# Patient Record
Sex: Male | Born: 1955 | Race: White | Hispanic: No | Marital: Married | Smoking: Current every day smoker
Health system: Southern US, Community
[De-identification: ages and names within clinical notes are randomized; demographics above are authoritative.]

## PROBLEM LIST (undated history)

## (undated) DIAGNOSIS — M1990 Unspecified osteoarthritis, unspecified site: Secondary | ICD-10-CM

## (undated) DIAGNOSIS — I10 Essential (primary) hypertension: Secondary | ICD-10-CM

## (undated) DIAGNOSIS — E039 Hypothyroidism, unspecified: Secondary | ICD-10-CM

## (undated) HISTORY — PX: KNEE SURGERY: SHX244

## (undated) HISTORY — PX: HERNIA REPAIR: SHX51

## (undated) HISTORY — PX: EYE SURGERY: SHX253

## (undated) HISTORY — PX: REPLACEMENT TOTAL KNEE: SUR1224

---

## 2006-06-19 ENCOUNTER — Emergency Department: Payer: Self-pay

## 2010-12-26 ENCOUNTER — Emergency Department: Payer: Self-pay | Admitting: Emergency Medicine

## 2013-01-10 ENCOUNTER — Ambulatory Visit: Payer: Self-pay | Admitting: Internal Medicine

## 2019-12-06 ENCOUNTER — Other Ambulatory Visit: Payer: Self-pay | Admitting: Gastroenterology

## 2019-12-06 DIAGNOSIS — R1314 Dysphagia, pharyngoesophageal phase: Secondary | ICD-10-CM

## 2019-12-14 ENCOUNTER — Ambulatory Visit
Admission: RE | Admit: 2019-12-14 | Discharge: 2019-12-14 | Disposition: A | Discharge status: Home / Self Care | Payer: Medicare Other | Source: Ambulatory Visit | Attending: Gastroenterology | Admitting: Gastroenterology

## 2019-12-14 ENCOUNTER — Other Ambulatory Visit: Payer: Self-pay

## 2019-12-14 DIAGNOSIS — R1314 Dysphagia, pharyngoesophageal phase: Secondary | ICD-10-CM

## 2020-04-12 ENCOUNTER — Telehealth: Payer: Self-pay | Admitting: *Deleted

## 2020-04-12 ENCOUNTER — Encounter: Payer: Self-pay | Admitting: *Deleted

## 2020-04-12 DIAGNOSIS — Z87891 Personal history of nicotine dependence: Secondary | ICD-10-CM

## 2020-04-12 DIAGNOSIS — Z122 Encounter for screening for malignant neoplasm of respiratory organs: Secondary | ICD-10-CM

## 2020-04-12 NOTE — Telephone Encounter (Signed)
Received referral for initial lung cancer screening scan. Contacted patient and obtained smoking history,(current, 58.75) as well as answering questions related to screening process. Patient denies signs of lung cancer such as weight loss or hemoptysis. Patient denies comorbidity that would prevent curative treatment if lung cancer were found. Patient is scheduled for shared decision making visit and CT scan on 04/30/20 at 115pm.

## 2020-04-30 ENCOUNTER — Other Ambulatory Visit: Payer: Self-pay

## 2020-04-30 ENCOUNTER — Inpatient Hospital Stay: Payer: Medicare Other | Attending: Nurse Practitioner | Admitting: Nurse Practitioner

## 2020-04-30 ENCOUNTER — Ambulatory Visit
Admission: RE | Admit: 2020-04-30 | Discharge: 2020-04-30 | Disposition: A | Discharge status: Home / Self Care | Payer: Medicare Other | Source: Ambulatory Visit | Attending: Nurse Practitioner | Admitting: Nurse Practitioner

## 2020-04-30 DIAGNOSIS — Z87891 Personal history of nicotine dependence: Secondary | ICD-10-CM | POA: Insufficient documentation

## 2020-04-30 DIAGNOSIS — Z122 Encounter for screening for malignant neoplasm of respiratory organs: Secondary | ICD-10-CM | POA: Diagnosis present

## 2020-04-30 NOTE — Progress Notes (Signed)
Virtual Visit via Video Enabled Telemedicine Note   I connected with Lynden Oxford on 04/30/20 at 1:00 PM EST by video enabled telemedicine visit and verified that I am speaking with the correct person using two identifiers.   I discussed the limitations, risks, security and privacy concerns of performing an evaluation and management service by telemedicine and the availability of in-person appointments. I also discussed with the patient that there may be a patient responsible charge related to this service. The patient expressed understanding and agreed to proceed.   Other persons participating in the visit and their role in the encounter: Burgess Estelle, RN- checking in patient & navigation  Patient's location: [place]  Provider's location: Clinic  Chief Complaint: Low Dose CT Screening  Patient agreed to evaluation by telemedicine to discuss shared decision making for consideration of low dose CT lung cancer screening.    In accordance with CMS guidelines, patient has met eligibility criteria including age, absence of signs or symptoms of lung cancer.  Social History   Tobacco Use  . Smoking status: Current Every Day Smoker    Packs/day: 1.25    Years: 47.00    Pack years: 58.75    Types: Cigarettes  . Smokeless tobacco: Not on file  Substance Use Topics  . Alcohol use: Not on file     A shared decision-making session was conducted prior to the performance of CT scan. This includes one or more decision aids, includes benefits and harms of screening, follow-up diagnostic testing, over-diagnosis, false positive rate, and total radiation exposure.   Counseling on the importance of adherence to annual lung cancer LDCT screening, impact of co-morbidities, and ability or willingness to undergo diagnosis and treatment is imperative for compliance of the program.   Counseling on the importance of continued smoking cessation for former smokers; the importance of smoking cessation for  current smokers, and information about tobacco cessation interventions have been given to patient including [place] and [facility] [place] programs.   Written order for lung cancer screening with LDCT has been given to the patient and any and all questions have been answered to the best of my abilities.    Yearly follow up will be coordinated by Burgess Estelle, Thoracic Navigator.  I discussed the assessment and treatment plan with the patient. The patient was provided an opportunity to ask questions and all were answered. The patient agreed with the plan and demonstrated an understanding of the instructions.   The patient was advised to call back or seek an in-person evaluation if the symptoms worsen or if the condition fails to improve as anticipated.   I provided 15 minutes of face-to-face video visit time during this encounter, and > 50% was spent counseling as documented under my assessment & plan.   Beckey Rutter, DNP, AGNP-C [place] at [facility] [phone] (clinic)

## 2020-05-01 ENCOUNTER — Telehealth: Payer: Self-pay | Admitting: *Deleted

## 2020-05-01 NOTE — Telephone Encounter (Signed)
Notified patient of LDCT lung cancer screening program results with recommendation for 12 month follow up imaging. Also notified of incidental findings noted below and is encouraged to discuss further with PCP who will receive a copy of this note and/or the CT report. Patient verbalizes understanding.   IMPRESSION: 1. Lung-RADS 1, negative. Continue annual screening with low-dose chest CT without contrast in 12 months. 2. Aortic atherosclerosis. 3. Colonic diverticulosis.  Aortic Atherosclerosis (ICD10-I70

## 2020-05-10 ENCOUNTER — Encounter (INDEPENDENT_AMBULATORY_CARE_PROVIDER_SITE_OTHER): Payer: Medicare Other | Admitting: Ophthalmology

## 2020-05-10 ENCOUNTER — Other Ambulatory Visit: Payer: Self-pay

## 2020-05-10 DIAGNOSIS — H43813 Vitreous degeneration, bilateral: Secondary | ICD-10-CM | POA: Diagnosis not present

## 2020-05-10 DIAGNOSIS — H44752 Retained (nonmagnetic) (old) foreign body in vitreous body, left eye: Secondary | ICD-10-CM | POA: Diagnosis not present

## 2020-05-10 DIAGNOSIS — H2701 Aphakia, right eye: Secondary | ICD-10-CM

## 2020-05-10 DIAGNOSIS — H27131 Posterior dislocation of lens, right eye: Secondary | ICD-10-CM

## 2020-05-28 ENCOUNTER — Other Ambulatory Visit: Payer: Self-pay

## 2020-05-28 ENCOUNTER — Encounter (INDEPENDENT_AMBULATORY_CARE_PROVIDER_SITE_OTHER): Payer: Medicare Other | Admitting: Ophthalmology

## 2020-05-28 DIAGNOSIS — H43813 Vitreous degeneration, bilateral: Secondary | ICD-10-CM

## 2020-05-28 DIAGNOSIS — H27131 Posterior dislocation of lens, right eye: Secondary | ICD-10-CM

## 2020-05-28 DIAGNOSIS — H44752 Retained (nonmagnetic) (old) foreign body in vitreous body, left eye: Secondary | ICD-10-CM | POA: Diagnosis not present

## 2020-05-28 DIAGNOSIS — H2703 Aphakia, bilateral: Secondary | ICD-10-CM

## 2020-05-28 DIAGNOSIS — T8522XA Displacement of intraocular lens, initial encounter: Secondary | ICD-10-CM | POA: Diagnosis present

## 2020-05-28 NOTE — H&P (Signed)
Derek Fry is an 65 y.o. male.   Chief Complaint:sudden loss of vision left eye HPI: Cataract surgery in 2003.  Sudden vision loss 3 weeks ago.  Intraocular lens found in vitreous left eye.  No past medical history on file.    No family history on file. Social History:  reports that he has been smoking cigarettes. He has a 58.75 pack-year smoking history. He does not have any smokeless tobacco history on file. No history on file for alcohol use and drug use.  Allergies: Not on File  No medications prior to admission.    Review of systems otherwise negative  There were no vitals taken for this visit.  Physical exam: Mental status: oriented x3. Eyes: See eye exam associated with this date of surgery in media tab.  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 Plan: To [facility] for Pars plana vitrectomy, removal of intraocular lens from vitreous, placement of new intraocular lens with suture, laser, gas injection left eye  Derek Fry 05/28/2020, 4:14 PM

## 2020-06-14 ENCOUNTER — Other Ambulatory Visit (HOSPITAL_COMMUNITY)
Admission: RE | Admit: 2020-06-14 | Discharge: 2020-06-14 | Disposition: A | Discharge status: Home / Self Care | Payer: Medicare Other | Source: Ambulatory Visit | Attending: Ophthalmology | Admitting: Ophthalmology

## 2020-06-14 DIAGNOSIS — Z01812 Encounter for preprocedural laboratory examination: Secondary | ICD-10-CM | POA: Diagnosis present

## 2020-06-14 DIAGNOSIS — Z20822 Contact with and (suspected) exposure to covid-19: Secondary | ICD-10-CM | POA: Diagnosis not present

## 2020-06-14 LAB — SARS CORONAVIRUS 2 (TAT 6-24 HRS): SARS Coronavirus 2: NEGATIVE

## 2020-06-17 ENCOUNTER — Encounter (HOSPITAL_COMMUNITY): Payer: Self-pay | Admitting: Ophthalmology

## 2020-06-17 NOTE — Progress Notes (Signed)
Spoke with pt for pre-op call. Pt denies cardiac history. States he was treated "for a while for HTN, but was taken off medications 4-5 years ago. Pt states he is not diabetic.  Covid test done 06/14/20 and it's negative. Pt states he's been in quarantine since the test was done and understands that he stays in quarantine until he comes to the hospital tomorrow.   Dr. Ashley Royalty instructed pt to take his Synthroid and Flomax tonight, not in the AM. He instructed pt to arrive at the hospital at 9:00 AM.

## 2020-06-18 ENCOUNTER — Ambulatory Visit (HOSPITAL_COMMUNITY): Payer: Medicare Other | Admitting: Certified Registered Nurse Anesthetist

## 2020-06-18 ENCOUNTER — Ambulatory Visit (HOSPITAL_COMMUNITY)
Admission: RE | Admit: 2020-06-18 | Discharge: 2020-06-18 | Disposition: A | Discharge status: Home / Self Care | Payer: Medicare Other | Attending: Ophthalmology | Admitting: Ophthalmology

## 2020-06-18 ENCOUNTER — Encounter (HOSPITAL_COMMUNITY): Payer: Self-pay | Admitting: Ophthalmology

## 2020-06-18 ENCOUNTER — Other Ambulatory Visit: Payer: Self-pay

## 2020-06-18 ENCOUNTER — Encounter (HOSPITAL_COMMUNITY)
Admission: RE | Disposition: A | Discharge status: Home / Self Care | Payer: Self-pay | Source: Home / Self Care | Attending: Ophthalmology

## 2020-06-18 DIAGNOSIS — X58XXXA Exposure to other specified factors, initial encounter: Secondary | ICD-10-CM | POA: Insufficient documentation

## 2020-06-18 DIAGNOSIS — H2702 Aphakia, left eye: Secondary | ICD-10-CM | POA: Diagnosis not present

## 2020-06-18 DIAGNOSIS — H27132 Posterior dislocation of lens, left eye: Secondary | ICD-10-CM | POA: Diagnosis not present

## 2020-06-18 DIAGNOSIS — T8522XA Displacement of intraocular lens, initial encounter: Secondary | ICD-10-CM | POA: Diagnosis not present

## 2020-06-18 DIAGNOSIS — F17210 Nicotine dependence, cigarettes, uncomplicated: Secondary | ICD-10-CM | POA: Insufficient documentation

## 2020-06-18 HISTORY — PX: PARS PLANA VITRECTOMY: SHX2166

## 2020-06-18 HISTORY — DX: Unspecified osteoarthritis, unspecified site: M19.90

## 2020-06-18 HISTORY — DX: Hypothyroidism, unspecified: E03.9

## 2020-06-18 HISTORY — DX: Essential (primary) hypertension: I10

## 2020-06-18 LAB — POCT I-STAT, CHEM 8
BUN: 16 mg/dL (ref 8–23)
Calcium, Ion: 1.16 mmol/L (ref 1.15–1.40)
Chloride: 107 mmol/L (ref 98–111)
Creatinine, Ser: 1 mg/dL (ref 0.61–1.24)
Glucose, Bld: 97 mg/dL (ref 70–99)
HCT: 51 % (ref 39.0–52.0)
Hemoglobin: 17.3 g/dL — ABNORMAL HIGH (ref 13.0–17.0)
Potassium: 3.9 mmol/L (ref 3.5–5.1)
Sodium: 141 mmol/L (ref 135–145)
TCO2: 20 mmol/L — ABNORMAL LOW (ref 22–32)

## 2020-06-18 SURGERY — PARS PLANA VITRECTOMY WITH 25G REMOVAL/SUTURE INTRAOCULAR LENS
Anesthesia: General | Site: Eye | Laterality: Left

## 2020-06-18 MED ORDER — GATIFLOXACIN 0.5 % OP SOLN
1.0000 [drp] | OPHTHALMIC | Status: AC | PRN
  Administered 2020-06-18 (×3): 1 [drp] via OPHTHALMIC
  Filled 2020-06-18: qty 2.5

## 2020-06-18 MED ORDER — DORZOLAMIDE HCL-TIMOLOL MAL 2-0.5 % OP SOLN
1.0000 [drp] | Freq: Two times a day (BID) | OPHTHALMIC | Status: DC
  Administered 2020-06-18: 1 [drp] via OPHTHALMIC
  Filled 2020-06-18 (×2): qty 10

## 2020-06-18 MED ORDER — SUGAMMADEX SODIUM 200 MG/2ML IV SOLN
INTRAVENOUS | Status: DC | PRN
  Administered 2020-06-18: 200 mg via INTRAVENOUS

## 2020-06-18 MED ORDER — PHENYLEPHRINE HCL-NACL 10-0.9 MG/250ML-% IV SOLN
INTRAVENOUS | Status: DC | PRN
  Administered 2020-06-18: 25 ug/min via INTRAVENOUS

## 2020-06-18 MED ORDER — BSS IO SOLN
INTRAOCULAR | Status: DC | PRN
  Administered 2020-06-18 (×2): 15 mL via INTRAOCULAR

## 2020-06-18 MED ORDER — PHENYLEPHRINE HCL 2.5 % OP SOLN
1.0000 [drp] | OPHTHALMIC | Status: AC | PRN
  Administered 2020-06-18 (×3): 1 [drp] via OPHTHALMIC
  Filled 2020-06-18: qty 2

## 2020-06-18 MED ORDER — FENTANYL CITRATE (PF) 250 MCG/5ML IJ SOLN
INTRAMUSCULAR | Status: DC | PRN
  Administered 2020-06-18: 100 ug via INTRAVENOUS

## 2020-06-18 MED ORDER — TRIAMCINOLONE ACETONIDE 40 MG/ML IJ SUSP
INTRAMUSCULAR | Status: AC
  Filled 2020-06-18: qty 5

## 2020-06-18 MED ORDER — BACITRACIN-POLYMYXIN B 500-10000 UNIT/GM OP OINT
TOPICAL_OINTMENT | OPHTHALMIC | Status: AC
  Filled 2020-06-18: qty 3.5

## 2020-06-18 MED ORDER — PHENYLEPHRINE HCL (PRESSORS) 10 MG/ML IV SOLN
INTRAVENOUS | Status: DC | PRN
  Administered 2020-06-18 (×2): 80 ug via INTRAVENOUS

## 2020-06-18 MED ORDER — FENTANYL CITRATE (PF) 100 MCG/2ML IJ SOLN
25.0000 ug | INTRAMUSCULAR | Status: DC | PRN
Start: 2020-06-18 — End: 2020-06-18

## 2020-06-18 MED ORDER — HEMOSTATIC AGENTS (NO CHARGE) OPTIME
TOPICAL | Status: DC | PRN
  Administered 2020-06-18: 1 via TOPICAL

## 2020-06-18 MED ORDER — BSS PLUS IO SOLN
INTRAOCULAR | Status: AC
  Filled 2020-06-18: qty 500

## 2020-06-18 MED ORDER — BUPIVACAINE HCL (PF) 0.75 % IJ SOLN
INTRAMUSCULAR | Status: DC | PRN
  Administered 2020-06-18: 10 mL

## 2020-06-18 MED ORDER — LIDOCAINE HCL 2 % IJ SOLN
INTRAMUSCULAR | Status: AC
  Filled 2020-06-18: qty 20

## 2020-06-18 MED ORDER — CEFTAZIDIME 1 G IJ SOLR
INTRAMUSCULAR | Status: AC
  Filled 2020-06-18: qty 1

## 2020-06-18 MED ORDER — FENTANYL CITRATE (PF) 250 MCG/5ML IJ SOLN
INTRAMUSCULAR | Status: AC
  Filled 2020-06-18: qty 5

## 2020-06-18 MED ORDER — STERILE WATER FOR INJECTION IJ SOLN
INTRAMUSCULAR | Status: DC | PRN
  Administered 2020-06-18: 20 mL

## 2020-06-18 MED ORDER — ORAL CARE MOUTH RINSE: 15.0000 mL | Freq: Once | OROMUCOSAL | Status: AC

## 2020-06-18 MED ORDER — MIDAZOLAM HCL 2 MG/2ML IJ SOLN
INTRAMUSCULAR | Status: AC
  Filled 2020-06-18: qty 2

## 2020-06-18 MED ORDER — BUPIVACAINE HCL (PF) 0.75 % IJ SOLN
INTRAMUSCULAR | Status: AC
  Filled 2020-06-18: qty 10

## 2020-06-18 MED ORDER — CEFAZOLIN SODIUM-DEXTROSE 2-4 GM/100ML-% IV SOLN
2.0000 g | INTRAVENOUS | Status: AC
  Administered 2020-06-18: 2 g via INTRAVENOUS

## 2020-06-18 MED ORDER — ROCURONIUM BROMIDE 10 MG/ML (PF) SYRINGE
PREFILLED_SYRINGE | INTRAVENOUS | Status: DC | PRN
  Administered 2020-06-18 (×2): 20 mg via INTRAVENOUS
  Administered 2020-06-18: 50 mg via INTRAVENOUS
  Administered 2020-06-18: 10 mg via INTRAVENOUS

## 2020-06-18 MED ORDER — POLYMYXIN B SULFATE 500000 UNITS IJ SOLR
INTRAMUSCULAR | Status: AC
  Filled 2020-06-18: qty 500000

## 2020-06-18 MED ORDER — SODIUM HYALURONATE 10 MG/ML IO SOLN
INTRAOCULAR | Status: AC
  Filled 2020-06-18: qty 0.85

## 2020-06-18 MED ORDER — DEXAMETHASONE SODIUM PHOSPHATE 10 MG/ML IJ SOLN
INTRAMUSCULAR | Status: DC | PRN
  Administered 2020-06-18: 10 mg

## 2020-06-18 MED ORDER — SODIUM CHLORIDE 0.9 % IV SOLN: INTRAVENOUS | Status: DC

## 2020-06-18 MED ORDER — ACETAZOLAMIDE SODIUM 500 MG IJ SOLR
INTRAMUSCULAR | Status: AC
  Filled 2020-06-18: qty 500

## 2020-06-18 MED ORDER — EPINEPHRINE PF 1 MG/ML IJ SOLN
INTRAMUSCULAR | Status: DC | PRN
  Administered 2020-06-18: .3 mL

## 2020-06-18 MED ORDER — HYALURONIDASE HUMAN 150 UNIT/ML IJ SOLN
INTRAMUSCULAR | Status: AC
  Filled 2020-06-18: qty 1

## 2020-06-18 MED ORDER — STERILE WATER FOR INJECTION IJ SOLN
INTRAMUSCULAR | Status: AC
  Filled 2020-06-18: qty 20

## 2020-06-18 MED ORDER — PROPOFOL 10 MG/ML IV BOLUS
INTRAVENOUS | Status: DC | PRN
  Administered 2020-06-18: 150 mg via INTRAVENOUS
  Administered 2020-06-18: 50 mg via INTRAVENOUS

## 2020-06-18 MED ORDER — CEFAZOLIN SODIUM-DEXTROSE 2-4 GM/100ML-% IV SOLN
INTRAVENOUS | Status: AC
  Filled 2020-06-18: qty 100

## 2020-06-18 MED ORDER — DEXAMETHASONE SODIUM PHOSPHATE 10 MG/ML IJ SOLN
INTRAMUSCULAR | Status: AC
  Filled 2020-06-18: qty 1

## 2020-06-18 MED ORDER — BSS IO SOLN
INTRAOCULAR | Status: AC
  Filled 2020-06-18: qty 15

## 2020-06-18 MED ORDER — 0.9 % SODIUM CHLORIDE (POUR BTL) OPTIME
TOPICAL | Status: DC | PRN
  Administered 2020-06-18: 1000 mL

## 2020-06-18 MED ORDER — MIDAZOLAM HCL 5 MG/5ML IJ SOLN
INTRAMUSCULAR | Status: DC | PRN
  Administered 2020-06-18: 2 mg via INTRAVENOUS

## 2020-06-18 MED ORDER — CHLORHEXIDINE GLUCONATE 0.12 % MT SOLN
OROMUCOSAL | Status: AC
  Administered 2020-06-18: 15 mL via OROMUCOSAL
  Filled 2020-06-18: qty 15

## 2020-06-18 MED ORDER — MEPERIDINE HCL 25 MG/ML IJ SOLN: 6.2500 mg | INTRAMUSCULAR | Status: DC | PRN

## 2020-06-18 MED ORDER — DEXAMETHASONE SODIUM PHOSPHATE 10 MG/ML IJ SOLN
INTRAMUSCULAR | Status: DC | PRN
  Administered 2020-06-18: 5 mg via INTRAVENOUS

## 2020-06-18 MED ORDER — ONDANSETRON HCL 4 MG/2ML IJ SOLN
INTRAMUSCULAR | Status: DC | PRN
  Administered 2020-06-18: 4 mg via INTRAVENOUS

## 2020-06-18 MED ORDER — HYPROMELLOSE (GONIOSCOPIC) 2.5 % OP SOLN
OPHTHALMIC | Status: AC
  Filled 2020-06-18: qty 15

## 2020-06-18 MED ORDER — EPINEPHRINE PF 1 MG/ML IJ SOLN
INTRAMUSCULAR | Status: AC
  Filled 2020-06-18: qty 1

## 2020-06-18 MED ORDER — TROPICAMIDE 1 % OP SOLN
1.0000 [drp] | OPHTHALMIC | Status: AC | PRN
  Administered 2020-06-18 (×3): 1 [drp] via OPHTHALMIC
  Filled 2020-06-18: qty 15

## 2020-06-18 MED ORDER — BACITRACIN-POLYMYXIN B 500-10000 UNIT/GM OP OINT
TOPICAL_OINTMENT | OPHTHALMIC | Status: DC | PRN
  Administered 2020-06-18: 1 via OPHTHALMIC

## 2020-06-18 MED ORDER — PROMETHAZINE HCL 25 MG/ML IJ SOLN: 6.2500 mg | INTRAMUSCULAR | Status: DC | PRN

## 2020-06-18 MED ORDER — PROPOFOL 10 MG/ML IV BOLUS
INTRAVENOUS | Status: AC
  Filled 2020-06-18: qty 20

## 2020-06-18 MED ORDER — SODIUM CHLORIDE (PF) 0.9 % IJ SOLN
INTRAMUSCULAR | Status: AC
  Filled 2020-06-18: qty 10

## 2020-06-18 MED ORDER — CHLORHEXIDINE GLUCONATE 0.12 % MT SOLN: 15.0000 mL | Freq: Once | OROMUCOSAL | Status: AC

## 2020-06-18 MED ORDER — BSS PLUS IO SOLN
INTRAOCULAR | Status: DC | PRN
  Administered 2020-06-18: 1 via INTRAOCULAR

## 2020-06-18 MED ORDER — CYCLOPENTOLATE HCL 1 % OP SOLN
1.0000 [drp] | OPHTHALMIC | Status: AC | PRN
  Administered 2020-06-18 (×3): 1 [drp] via OPHTHALMIC
  Filled 2020-06-18: qty 2

## 2020-06-18 MED ORDER — ATROPINE SULFATE 1 % OP SOLN
OPHTHALMIC | Status: AC
  Filled 2020-06-18: qty 5

## 2020-06-18 MED ORDER — PROVISC 10 MG/ML IO SOLN
INTRAOCULAR | Status: DC | PRN
  Administered 2020-06-18: .85 mL via INTRAOCULAR

## 2020-06-18 SURGICAL SUPPLY — 70 items
APPLICATOR DR MATTHEWS STRL (MISCELLANEOUS) IMPLANT
BAND WRIST GAS GREEN (MISCELLANEOUS) IMPLANT
BLADE EYE CATARACT 19 1.4 BEAV (BLADE) IMPLANT
BLADE KERATOME 2.75 (BLADE) ×2 IMPLANT
CABLE BIPOLOR RESECTION CORD (MISCELLANEOUS) ×2 IMPLANT
CANNULA VLV SOFT TIP 25G (OPHTHALMIC) ×1 IMPLANT
CANNULA VLV SOFT TIP 25GA (OPHTHALMIC) ×2 IMPLANT
COTTONBALL LRG STERILE PKG (GAUZE/BANDAGES/DRESSINGS) ×6 IMPLANT
COVER MAYO STAND STRL (DRAPES) ×2 IMPLANT
COVER WAND RF STERILE (DRAPES) ×2 IMPLANT
DRAPE INCISE 51X51 W/FILM STRL (DRAPES) IMPLANT
DRAPE OPHTHALMIC 77X100 STRL (CUSTOM PROCEDURE TRAY) ×2 IMPLANT
FILTER BLUE MILLIPORE (MISCELLANEOUS) IMPLANT
FORCEPS ECKARDT ILM 25G SERR (OPHTHALMIC RELATED) IMPLANT
FORCEPS GRIESHABER ILM 25G A (INSTRUMENTS) ×2 IMPLANT
FORCEPS HORIZONTAL 25G DISP (OPHTHALMIC RELATED) IMPLANT
GAS OPHTHALMIC (MISCELLANEOUS) IMPLANT
GAS WRIST BAND GREEN (MISCELLANEOUS)
GLOVE SS BIOGEL STRL SZ 6.5 (GLOVE) ×2 IMPLANT
GLOVE SS BIOGEL STRL SZ 7 (GLOVE) ×1 IMPLANT
GLOVE SUPERSENSE BIOGEL SZ 6.5 (GLOVE) ×2
GLOVE SUPERSENSE BIOGEL SZ 7 (GLOVE) ×1
GLOVE SURG 8.5 LATEX PF (GLOVE) ×2 IMPLANT
GLOVE TRIUMPH SURG SIZE 8.5 (KITS) ×2 IMPLANT
GOWN STRL REUS W/ TWL LRG LVL3 (GOWN DISPOSABLE) ×3 IMPLANT
GOWN STRL REUS W/TWL LRG LVL3 (GOWN DISPOSABLE) ×3
HANDLE PNEUMATIC FOR CONSTEL (OPHTHALMIC) ×2 IMPLANT
KIT BASIN OR (CUSTOM PROCEDURE TRAY) ×2 IMPLANT
KIT TURNOVER KIT B (KITS) ×2 IMPLANT
KNIFE CRESCENT 1.75 EDGEAHEAD (BLADE) IMPLANT
LENS IOL POST 1PIECE DIOP 26.5 (Intraocular Lens) ×1 IMPLANT
MICROPICK 25G (MISCELLANEOUS)
NDL 18GX1X1/2 (RX/OR ONLY) (NEEDLE) ×1 IMPLANT
NDL 25GX 5/8IN NON SAFETY (NEEDLE) ×1 IMPLANT
NDL 27GX1/2 REG BEVEL ECLIP (NEEDLE) IMPLANT
NDL FILTER BLUNT 18X1 1/2 (NEEDLE) ×1 IMPLANT
NDL HYPO 30X.5 LL (NEEDLE) ×1 IMPLANT
NEEDLE 18GX1X1/2 (RX/OR ONLY) (NEEDLE) ×2 IMPLANT
NEEDLE 25GX 5/8IN NON SAFETY (NEEDLE) ×2 IMPLANT
NEEDLE 27GX1/2 REG BEVEL ECLIP (NEEDLE) IMPLANT
NEEDLE FILTER BLUNT 18X 1/2SAF (NEEDLE) ×1
NEEDLE FILTER BLUNT 18X1 1/2 (NEEDLE) ×1 IMPLANT
NEEDLE HYPO 30X.5 LL (NEEDLE) ×2 IMPLANT
NS IRRIG 1000ML POUR BTL (IV SOLUTION) ×2 IMPLANT
PACK VITRECTOMY CUSTOM (CUSTOM PROCEDURE TRAY) ×2 IMPLANT
PAD ARMBOARD 7.5X6 YLW CONV (MISCELLANEOUS) ×4 IMPLANT
PAK PIK VITRECTOMY CVS 25GA (OPHTHALMIC) ×2 IMPLANT
PENCIL BIPOLAR 25GA STR DISP (OPHTHALMIC RELATED) IMPLANT
PIC ILLUMINATED 25G (OPHTHALMIC) ×2
PICK MICROPICK 25G (MISCELLANEOUS) IMPLANT
PIK ILLUMINATED 25G (OPHTHALMIC) ×1 IMPLANT
PROBE LASER ILLUM FLEX CVD 25G (OPHTHALMIC) IMPLANT
ROLLS DENTAL (MISCELLANEOUS) ×4 IMPLANT
SCRAPER DIAMOND 25GA (OPHTHALMIC RELATED) IMPLANT
SPONGE SURGIFOAM ABS GEL 12-7 (HEMOSTASIS) ×2 IMPLANT
STOPCOCK 4 WAY LG BORE MALE ST (IV SETS) IMPLANT
SUT CHROMIC 7 0 TG140 8 (SUTURE) ×2 IMPLANT
SUT ETHILON 10 0 CS140 6 (SUTURE) ×2 IMPLANT
SUT ETHILON 9 0 TG140 8 (SUTURE) IMPLANT
SUT POLY NON ABSORB 10-0 8 STR (SUTURE) ×3 IMPLANT
SUT SILK 4 0 RB 1 (SUTURE) ×1 IMPLANT
SYR 10ML LL (SYRINGE) IMPLANT
SYR 20ML LL LF (SYRINGE) ×2 IMPLANT
SYR 5ML LL (SYRINGE) IMPLANT
SYR BULB EAR ULCER 3OZ GRN STR (SYRINGE) ×2 IMPLANT
SYR TB 1ML LUER SLIP (SYRINGE) ×2 IMPLANT
TAPE SURG TRANSPORE 1 IN (GAUZE/BANDAGES/DRESSINGS) ×1 IMPLANT
TAPE SURGICAL TRANSPORE 1 IN (GAUZE/BANDAGES/DRESSINGS) ×1
TUBING HIGH PRESS EXTEN 6IN (TUBING) IMPLANT
WATER STERILE IRR 1000ML POUR (IV SOLUTION) ×2 IMPLANT

## 2020-06-18 NOTE — H&P (Signed)
I examined the patient today and there is no change in the medical status 

## 2020-06-18 NOTE — Anesthesia Procedure Notes (Signed)
Procedure Name: Intubation Date/Time: 06/18/2020 11:40 AM Performed by: Glynda Jaeger, CRNA Pre-anesthesia Checklist: Patient identified, Patient being monitored, Timeout performed, Emergency Drugs available and Suction available Patient Re-evaluated:Patient Re-evaluated prior to induction Oxygen Delivery Method: Circle System Utilized Preoxygenation: Pre-oxygenation with 100% oxygen Induction Type: IV induction Ventilation: Two handed mask ventilation required and Oral airway inserted - appropriate to patient size Laryngoscope Size: Mac and 4 Grade View: Grade III Tube type: Oral Tube size: 7.5 mm Number of attempts: 1 Airway Equipment and Method: Stylet Placement Confirmation: ETT inserted through vocal cords under direct vision,  positive ETCO2 and breath sounds checked- equal and bilateral Secured at: 21 cm Tube secured with: Tape Dental Injury: Teeth and Oropharynx as per pre-operative assessment

## 2020-06-18 NOTE — Progress Notes (Signed)
Dr. Renold Don aware that patient's BMET hemolyzed and gave verbal order for ISTAT 8.

## 2020-06-18 NOTE — Anesthesia Preprocedure Evaluation (Addendum)
Anesthesia Evaluation  Patient identified by MRN, date of birth, ID band Patient awake    Reviewed: Allergy & Precautions, NPO status , Patient's Chart, lab work & pertinent test results  Airway Mallampati: II  TM Distance: >3 FB Neck ROM: Full    Dental  (+) Dental Advisory Given, Missing, Chipped   Pulmonary Current Smoker,    Pulmonary exam normal breath sounds clear to auscultation       Cardiovascular hypertension, Pt. on medications Normal cardiovascular exam Rhythm:Regular Rate:Normal     Neuro/Psych negative neurological ROS     GI/Hepatic negative GI ROS, Neg liver ROS,   Endo/Other  Hypothyroidism   Renal/GU negative Renal ROS     Musculoskeletal  (+) Arthritis ,   Abdominal   Peds  Hematology negative hematology ROS (+)   Anesthesia Other Findings   Reproductive/Obstetrics                            Anesthesia Physical Anesthesia Plan  ASA: II  Anesthesia Plan: General   Post-op Pain Management:    Induction: Intravenous  PONV Risk Score and Plan: 1 and Ondansetron, Dexamethasone, Midazolam and Treatment may vary due to age or medical condition  Airway Management Planned: Oral ETT  Additional Equipment: None  Intra-op Plan:   Post-operative Plan: Extubation in OR  Informed Consent: I have reviewed the patients History and Physical, chart, labs and discussed the procedure including the risks, benefits and alternatives for the proposed anesthesia with the patient or authorized representative who has indicated his/her understanding and acceptance.     Dental advisory given  Plan Discussed with: CRNA  Anesthesia Plan Comments:        Anesthesia Quick Evaluation

## 2020-06-18 NOTE — Transfer of Care (Signed)
Immediate Anesthesia Transfer of Care Note  Patient: Derek Fry  Procedure(s) Performed: PARS PLANA VITRECTOMY WITH 25G REMOVAL/SUTURE INTRAOCULAR LENS; LASER, DIATHERMY LEFT EYE (Left Eye)  Patient Location: PACU  Anesthesia Type:General  Level of Consciousness: awake, alert , oriented, patient cooperative and responds to stimulation  Airway & Oxygen Therapy: Patient Spontanous Breathing and Patient connected to nasal cannula oxygen  Post-op Assessment: Report given to RN, Post -op Vital signs reviewed and stable and Patient moving all extremities X 4  Post vital signs: Reviewed and stable  Last Vitals:  Vitals Value Taken Time  BP 147/70 06/18/20 1412  Temp    Pulse 89 06/18/20 1414  Resp 18 06/18/20 1414  SpO2 96 % 06/18/20 1414  Vitals shown include unvalidated device data.  Last Pain:  Vitals:   06/18/20 0939  PainSc: 0-No pain      Patients Stated Pain Goal: 3 (06/18/20 0939)  Complications: No complications documented.

## 2020-06-18 NOTE — Op Note (Signed)
NAME: Derek Fry, Derek Fry MEDICAL RECORD CW:[id number] ACCOUNT [id number] DATE OF BIRTH:01-Mar-1956 FACILITY: [facility] LOCATION: [facility] PHYSICIAN:Brooke D. , MD  OPERATIVE REPORT  DATE OF PROCEDURE:  06/18/2020  ADMISSION DIAGNOSIS:  Dislocated intraocular lens posteriorly, left eye.  PROCEDURES:  Pars plana vitrectomy, removal of dislocated intraocular lens from the vitreous, placement of secondary intraocular lens with suture, laser, gas infusion, all in the left eye.  SURGEON:  Alan Mulder, MD  ASSISTANT:  Rosalie Doctor, SA  ANESTHESIA:  General.  DESCRIPTION OF PROCEDURE:  Usual prep and drape.  The indirect ophthalmoscope laser was moved into place.  806 burns were placed around the retinal periphery.  The power was 300 milliwatts 1000 microns each and 0.1 seconds each.  The attention was then  carried to the pars plana area.  There was a conjunctival peritomy from 8 o'clock to 4 o'clock.  Half-thickness scleral flaps were raised at 3 and 9 o'clock in anticipation of IOL suture.  Corneoscleral window was created between 10 o'clock and 2 o'clock  for 8 mm in width.  A 3-layered wound was created, which was self-sealing.  25-gauge trocars were placed at 10, 2, and 4 o'clock with infusion at 4 o'clock.  Provisc was placed on the corneal surface, and pars plana vitrectomy was begun.  The  intraocular lens was found in the posterior segment of the eye.  It was ensnared in vitreous.  The cutter was used to remove all vitreous attachments to the intraocular lens.  A corneoscleral wound was created from 10 o'clock to 2 o'clock and the  keratome was used to enter the anterior chamber.  Once the intraocular lens was freed from its vitreous attachments, it was raised into the anterior chamber with 25-gauge automated forceps.  Forceps were then passed through the corneoscleral wound to grab the lens and remove it from the eye.  The pseudophakos was sent for pathologic identification.  Additional  vitrectomy was carried out at this point removing all vitreous to wound fragments.  Two Prolene sutures were passed beneath the scleral  flaps at 3 and 9 o'clock.  A docking needle was used for the passing.  The Prolene sutures were externalized through the corneoscleral wound.  A new intraocular lens was brought onto the field.  This was made by [employer] model CZ7OBD,  power 26.5D, length 12.5 mm, optic 7.0 mm, serial number [id number], expiration date of 01/02/2023.  The lens was brought onto the field, inspected, and cleaned.  Prolene sutures were attached to the islets of the lens.  The lens was passed through  the corneoscleral wound into the anterior chamber and then into the ciliary sulcus.  The lens was dialed into place.  The Prolene sutures were drawn securely and knotted beneath the scleral flaps.  The intraocular lens was found to be secure.  The  corneal wound was closed with 6 interrupted 10-0 nylon sutures.  These were tested, and the wound was found to be secure.  Additional vitrectomy was carried out at this time removing additional vitreous remnants.  No vitreous hemorrhage was seen.  The  retina was inspected and found to be in good condition.  A 30% gas fluid exchange was performed.  The instruments were removed from the eye.  The wounds were tested and found to be secure.  The conjunctiva was closed with 7-0 chromic suture.  Polymyxin  and ceftazidime were rinsed around the globe for antibiotic coverage.  Decadron 10 mg was injected into the lower subconjunctival space.  Marcaine was injected around the globe for postoperative pain.  Closing pressure was 10 with a Barraquer tonometer.   Polysporin ophthalmic ointment, a patch and a shield were placed.  The patient was awakened and taken to recovery in satisfactory condition.  DURATION:  2 hours 30 minutes.  IN/NUANCE  D:06/18/2020 T:06/18/2020 JOB:[id number]

## 2020-06-19 ENCOUNTER — Encounter (INDEPENDENT_AMBULATORY_CARE_PROVIDER_SITE_OTHER): Payer: Medicare Other | Admitting: Ophthalmology

## 2020-06-19 ENCOUNTER — Encounter (HOSPITAL_COMMUNITY): Payer: Self-pay | Admitting: Ophthalmology

## 2020-06-19 DIAGNOSIS — H27132 Posterior dislocation of lens, left eye: Secondary | ICD-10-CM

## 2020-06-19 NOTE — Anesthesia Postprocedure Evaluation (Signed)
Anesthesia Post Note  Patient: Derek Fry  Procedure(s) Performed: PARS PLANA VITRECTOMY WITH 25G REMOVAL/SUTURE INTRAOCULAR LENS; LASER, DIATHERMY LEFT EYE (Left Eye)     Patient location during evaluation: PACU Anesthesia Type: General Level of consciousness: sedated and patient cooperative Pain management: pain level controlled Vital Signs Assessment: post-procedure vital signs reviewed and stable Respiratory status: spontaneous breathing Cardiovascular status: stable Anesthetic complications: no   No complications documented.  Last Vitals:  Vitals:   06/18/20 1430 06/18/20 1442  BP: (!) 144/66 (!) 144/62  Pulse: 89 84  Resp: 19 18  Temp:  (!) 36.3 C  SpO2: 93% 93%    Last Pain:  Vitals:   06/18/20 1412  PainSc: Asleep                 Lewie Loron

## 2020-06-25 ENCOUNTER — Other Ambulatory Visit: Payer: Self-pay

## 2020-06-25 ENCOUNTER — Encounter (INDEPENDENT_AMBULATORY_CARE_PROVIDER_SITE_OTHER): Payer: Medicare Other | Admitting: Ophthalmology

## 2020-06-25 DIAGNOSIS — H2702 Aphakia, left eye: Secondary | ICD-10-CM

## 2020-07-02 DIAGNOSIS — H27131 Posterior dislocation of lens, right eye: Secondary | ICD-10-CM | POA: Diagnosis present

## 2020-07-02 NOTE — H&P (Signed)
Derek Fry is an 65 y.o. male.   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 Arthritis   . Hypertension    took meds for a bit, none for 4-5 years as of 06/17/20  . Hypothyroidism     Past Surgical History:  Procedure Laterality Date  . EYE SURGERY     eye muscle surgery on right eye, left eye cataract with lens implant   . HERNIA REPAIR     left groin  . KNEE SURGERY     in high school  . PARS PLANA VITRECTOMY Left 06/18/2020   Procedure: PARS PLANA VITRECTOMY WITH 25G REMOVAL/SUTURE INTRAOCULAR LENS; LASER, DIATHERMY LEFT EYE;  Surgeon: Sherrie George, MD;  Location: [facility] OR;  Service: Ophthalmology;  Laterality: Left;  . REPLACEMENT TOTAL KNEE Left    July, 2021  . REPLACEMENT TOTAL KNEE Right    march 2021    No family history on file. Social History:  reports that he has been smoking cigarettes. He has a 58.75 pack-year smoking history. He has never used smokeless tobacco. He reports current alcohol use. He reports previous drug use.  Allergies: No Known Allergies  No medications prior to admission.    Review of systems otherwise negative  There were no vitals taken for this visit.  Physical exam: Mental status: oriented x3. Eyes: See eye exam associated with this date of surgery in media tab.  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

## 2020-07-16 ENCOUNTER — Encounter (INDEPENDENT_AMBULATORY_CARE_PROVIDER_SITE_OTHER): Payer: Medicare Other | Admitting: Ophthalmology

## 2020-07-16 ENCOUNTER — Other Ambulatory Visit: Payer: Self-pay

## 2020-07-16 DIAGNOSIS — H43811 Vitreous degeneration, right eye: Secondary | ICD-10-CM | POA: Diagnosis not present

## 2020-07-16 DIAGNOSIS — H27131 Posterior dislocation of lens, right eye: Secondary | ICD-10-CM

## 2020-07-16 DIAGNOSIS — H2703 Aphakia, bilateral: Secondary | ICD-10-CM | POA: Diagnosis not present

## 2020-07-19 ENCOUNTER — Encounter (HOSPITAL_COMMUNITY): Payer: Self-pay | Admitting: Ophthalmology

## 2020-07-26 ENCOUNTER — Other Ambulatory Visit: Payer: Self-pay

## 2020-07-26 ENCOUNTER — Other Ambulatory Visit
Admission: RE | Admit: 2020-07-26 | Discharge: 2020-07-26 | Disposition: A | Discharge status: Home / Self Care | Payer: Medicare Other | Source: Ambulatory Visit | Attending: Ophthalmology | Admitting: Ophthalmology

## 2020-07-26 DIAGNOSIS — Z20822 Contact with and (suspected) exposure to covid-19: Secondary | ICD-10-CM | POA: Insufficient documentation

## 2020-07-26 DIAGNOSIS — Z01812 Encounter for preprocedural laboratory examination: Secondary | ICD-10-CM | POA: Insufficient documentation

## 2020-07-26 LAB — SARS CORONAVIRUS 2 (TAT 6-24 HRS): SARS Coronavirus 2: NEGATIVE

## 2020-07-29 ENCOUNTER — Encounter (HOSPITAL_COMMUNITY): Payer: Self-pay | Admitting: Ophthalmology

## 2020-07-29 NOTE — Progress Notes (Signed)
Spoke with pt for pre-op call. I had called him a month ago when he had surgery on the other eye. He states nothing has changed with him medical and surgical history. Denies any recent chest pain or sob.  Covid test done 07/26/20 and it's negative. Pt states he's been in quarantine since the test was done and understands that he stays in quarantine until he comes to the hospital tomorrow.

## 2020-07-30 ENCOUNTER — Ambulatory Visit (HOSPITAL_COMMUNITY)
Admission: RE | Admit: 2020-07-30 | Discharge: 2020-07-30 | Disposition: A | Discharge status: Home / Self Care | Payer: Medicare Other | Attending: Ophthalmology | Admitting: Ophthalmology

## 2020-07-30 ENCOUNTER — Other Ambulatory Visit: Payer: Self-pay

## 2020-07-30 ENCOUNTER — Ambulatory Visit (HOSPITAL_COMMUNITY): Payer: Medicare Other | Admitting: Anesthesiology

## 2020-07-30 ENCOUNTER — Encounter (HOSPITAL_COMMUNITY)
Admission: RE | Disposition: A | Discharge status: Home / Self Care | Payer: Self-pay | Source: Home / Self Care | Attending: Ophthalmology

## 2020-07-30 ENCOUNTER — Encounter (HOSPITAL_COMMUNITY): Payer: Self-pay | Admitting: Ophthalmology

## 2020-07-30 DIAGNOSIS — H27131 Posterior dislocation of lens, right eye: Secondary | ICD-10-CM | POA: Insufficient documentation

## 2020-07-30 DIAGNOSIS — H269 Unspecified cataract: Secondary | ICD-10-CM | POA: Diagnosis present

## 2020-07-30 HISTORY — PX: PARS PLANA VITRECTOMY: SHX2166

## 2020-07-30 HISTORY — PX: LASER PHOTO ABLATION: SHX5942

## 2020-07-30 HISTORY — PX: AIR/FLUID EXCHANGE: SHX6494

## 2020-07-30 HISTORY — PX: CATARACT EXTRACTION EXTRACAPSULAR: SHX1305

## 2020-07-30 LAB — BASIC METABOLIC PANEL
Anion gap: 8 (ref 5–15)
BUN: 13 mg/dL (ref 8–23)
CO2: 23 mmol/L (ref 22–32)
Calcium: 8.9 mg/dL (ref 8.9–10.3)
Chloride: 108 mmol/L (ref 98–111)
Creatinine, Ser: 1.13 mg/dL (ref 0.61–1.24)
GFR, Estimated: >60 mL/min (ref >60)
Glucose, Bld: 104 mg/dL — ABNORMAL HIGH (ref 70–99)
Potassium: 4 mmol/L (ref 3.5–5.1)
Sodium: 139 mmol/L (ref 135–145)

## 2020-07-30 SURGERY — PARS PLANA VITRECTOMY WITH 25G REMOVAL/SUTURE INTRAOCULAR LENS
Anesthesia: General | Site: Eye | Laterality: Right

## 2020-07-30 MED ORDER — HYALURONIDASE HUMAN 150 UNIT/ML IJ SOLN
INTRAMUSCULAR | Status: AC
  Filled 2020-07-30: qty 1

## 2020-07-30 MED ORDER — EPINEPHRINE PF 1 MG/ML IJ SOLN
INTRAOCULAR | Status: DC | PRN
  Administered 2020-07-30: 500 mL

## 2020-07-30 MED ORDER — MIDAZOLAM HCL 2 MG/2ML IJ SOLN
INTRAMUSCULAR | Status: AC
  Filled 2020-07-30: qty 2

## 2020-07-30 MED ORDER — POLYMYXIN B SULFATE 500000 UNITS IJ SOLR
INTRAMUSCULAR | Status: AC
  Filled 2020-07-30: qty 500000

## 2020-07-30 MED ORDER — FENTANYL CITRATE (PF) 250 MCG/5ML IJ SOLN
INTRAMUSCULAR | Status: AC
  Filled 2020-07-30: qty 5

## 2020-07-30 MED ORDER — BSS IO SOLN
INTRAOCULAR | Status: AC
  Filled 2020-07-30: qty 15

## 2020-07-30 MED ORDER — ACETAMINOPHEN 500 MG PO TABS
1000.0000 mg | ORAL_TABLET | Freq: Once | ORAL | Status: AC
  Administered 2020-07-30: 1000 mg via ORAL
  Filled 2020-07-30: qty 2

## 2020-07-30 MED ORDER — BRIMONIDINE TARTRATE 0.15 % OP SOLN
1.0000 [drp] | Freq: Two times a day (BID) | OPHTHALMIC | Status: DC
  Administered 2020-07-30: 1 [drp] via OPHTHALMIC
  Filled 2020-07-30: qty 5

## 2020-07-30 MED ORDER — BSS IO SOLN
INTRAOCULAR | Status: DC | PRN
  Administered 2020-07-30 (×2): 15 mL via INTRAOCULAR

## 2020-07-30 MED ORDER — MEPERIDINE HCL 25 MG/ML IJ SOLN: 6.2500 mg | INTRAMUSCULAR | Status: DC | PRN

## 2020-07-30 MED ORDER — ROCURONIUM BROMIDE 10 MG/ML (PF) SYRINGE
PREFILLED_SYRINGE | INTRAVENOUS | Status: AC
  Filled 2020-07-30: qty 10

## 2020-07-30 MED ORDER — PHENYLEPHRINE HCL 2.5 % OP SOLN
1.0000 [drp] | OPHTHALMIC | Status: AC | PRN
  Administered 2020-07-30 (×3): 1 [drp] via OPHTHALMIC
  Filled 2020-07-30: qty 2

## 2020-07-30 MED ORDER — GATIFLOXACIN 0.5 % OP SOLN
1.0000 [drp] | OPHTHALMIC | Status: AC | PRN
  Administered 2020-07-30 (×3): 1 [drp] via OPHTHALMIC
  Filled 2020-07-30: qty 2.5

## 2020-07-30 MED ORDER — ARTIFICIAL TEARS OPHTHALMIC OINT
TOPICAL_OINTMENT | OPHTHALMIC | Status: AC
  Filled 2020-07-30: qty 3.5

## 2020-07-30 MED ORDER — EPINEPHRINE PF 1 MG/ML IJ SOLN
INTRAMUSCULAR | Status: AC
  Filled 2020-07-30: qty 1

## 2020-07-30 MED ORDER — PHENYLEPHRINE HCL-NACL 10-0.9 MG/250ML-% IV SOLN
INTRAVENOUS | Status: DC | PRN
  Administered 2020-07-30: 25 ug/min via INTRAVENOUS

## 2020-07-30 MED ORDER — DEXAMETHASONE SODIUM PHOSPHATE 10 MG/ML IJ SOLN
INTRAMUSCULAR | Status: AC
  Filled 2020-07-30: qty 1

## 2020-07-30 MED ORDER — PROPOFOL 10 MG/ML IV BOLUS
INTRAVENOUS | Status: AC
  Filled 2020-07-30: qty 20

## 2020-07-30 MED ORDER — BUPIVACAINE HCL (PF) 0.75 % IJ SOLN
INTRAMUSCULAR | Status: DC | PRN
  Administered 2020-07-30: 10 mL

## 2020-07-30 MED ORDER — DEXAMETHASONE SODIUM PHOSPHATE 10 MG/ML IJ SOLN
INTRAMUSCULAR | Status: DC | PRN
  Administered 2020-07-30: 10 mg

## 2020-07-30 MED ORDER — PROVISC 10 MG/ML IO SOLN
INTRAOCULAR | Status: DC | PRN
  Administered 2020-07-30: .85 mL via INTRAOCULAR

## 2020-07-30 MED ORDER — HYDROMORPHONE HCL 1 MG/ML IJ SOLN: 0.2500 mg | INTRAMUSCULAR | Status: DC | PRN

## 2020-07-30 MED ORDER — DEXAMETHASONE SODIUM PHOSPHATE 10 MG/ML IJ SOLN
INTRAMUSCULAR | Status: DC | PRN
  Administered 2020-07-30: 5 mg via INTRAVENOUS

## 2020-07-30 MED ORDER — SODIUM CHLORIDE (PF) 0.9 % IJ SOLN
INTRAMUSCULAR | Status: AC
  Filled 2020-07-30: qty 10

## 2020-07-30 MED ORDER — HYPROMELLOSE (GONIOSCOPIC) 2.5 % OP SOLN
OPHTHALMIC | Status: AC
  Filled 2020-07-30: qty 15

## 2020-07-30 MED ORDER — CEFTAZIDIME 1 G IJ SOLR
INTRAMUSCULAR | Status: AC
  Filled 2020-07-30: qty 1

## 2020-07-30 MED ORDER — CHLORHEXIDINE GLUCONATE 0.12 % MT SOLN: 15.0000 mL | Freq: Once | OROMUCOSAL | Status: AC

## 2020-07-30 MED ORDER — ORAL CARE MOUTH RINSE: 15.0000 mL | Freq: Once | OROMUCOSAL | Status: AC

## 2020-07-30 MED ORDER — ACETAZOLAMIDE SODIUM 500 MG IJ SOLR
INTRAMUSCULAR | Status: AC
  Filled 2020-07-30: qty 500

## 2020-07-30 MED ORDER — ONDANSETRON HCL 4 MG/2ML IJ SOLN
INTRAMUSCULAR | Status: AC
  Filled 2020-07-30: qty 2

## 2020-07-30 MED ORDER — SODIUM HYALURONATE 10 MG/ML IO SOLN
INTRAOCULAR | Status: AC
  Filled 2020-07-30: qty 0.85

## 2020-07-30 MED ORDER — TRIAMCINOLONE ACETONIDE 40 MG/ML IJ SUSP
INTRAMUSCULAR | Status: AC
  Filled 2020-07-30: qty 5

## 2020-07-30 MED ORDER — PROPOFOL 10 MG/ML IV BOLUS
INTRAVENOUS | Status: DC | PRN
  Administered 2020-07-30: 200 mg via INTRAVENOUS

## 2020-07-30 MED ORDER — BSS IO SOLN
INTRAOCULAR | Status: AC
  Filled 2020-07-30: qty 45

## 2020-07-30 MED ORDER — CEFAZOLIN SODIUM-DEXTROSE 2-4 GM/100ML-% IV SOLN
2.0000 g | INTRAVENOUS | Status: AC
  Administered 2020-07-30: 2 g via INTRAVENOUS
  Filled 2020-07-30: qty 100

## 2020-07-30 MED ORDER — LIDOCAINE 2% (20 MG/ML) 5 ML SYRINGE
INTRAMUSCULAR | Status: DC | PRN
  Administered 2020-07-30: 30 mg via INTRAVENOUS

## 2020-07-30 MED ORDER — LIDOCAINE HCL 2 % IJ SOLN
INTRAMUSCULAR | Status: AC
  Filled 2020-07-30: qty 20

## 2020-07-30 MED ORDER — SODIUM CHLORIDE 0.9 % IV SOLN: INTRAVENOUS | Status: DC

## 2020-07-30 MED ORDER — FENTANYL CITRATE (PF) 250 MCG/5ML IJ SOLN
INTRAMUSCULAR | Status: DC | PRN
  Administered 2020-07-30: 100 ug via INTRAVENOUS

## 2020-07-30 MED ORDER — MIDAZOLAM HCL 2 MG/2ML IJ SOLN
INTRAMUSCULAR | Status: DC | PRN
  Administered 2020-07-30: 2 mg via INTRAVENOUS

## 2020-07-30 MED ORDER — ROCURONIUM BROMIDE 10 MG/ML (PF) SYRINGE
PREFILLED_SYRINGE | INTRAVENOUS | Status: DC | PRN
  Administered 2020-07-30: 60 mg via INTRAVENOUS

## 2020-07-30 MED ORDER — STERILE WATER FOR INJECTION IJ SOLN
INTRAMUSCULAR | Status: AC
  Filled 2020-07-30: qty 20

## 2020-07-30 MED ORDER — STERILE WATER FOR INJECTION IJ SOLN
INTRAMUSCULAR | Status: DC | PRN
  Administered 2020-07-30: 20 mL

## 2020-07-30 MED ORDER — MIDAZOLAM HCL 2 MG/2ML IJ SOLN: 0.5000 mg | Freq: Once | INTRAMUSCULAR | Status: DC | PRN

## 2020-07-30 MED ORDER — BUPIVACAINE HCL (PF) 0.75 % IJ SOLN
INTRAMUSCULAR | Status: AC
  Filled 2020-07-30: qty 10

## 2020-07-30 MED ORDER — CHLORHEXIDINE GLUCONATE 0.12 % MT SOLN
OROMUCOSAL | Status: AC
  Administered 2020-07-30: 15 mL via OROMUCOSAL
  Filled 2020-07-30: qty 15

## 2020-07-30 MED ORDER — OXYCODONE HCL 5 MG/5ML PO SOLN: 5.0000 mg | Freq: Once | ORAL | Status: DC | PRN

## 2020-07-30 MED ORDER — OXYCODONE HCL 5 MG PO TABS: 5.0000 mg | ORAL_TABLET | Freq: Once | ORAL | Status: DC | PRN

## 2020-07-30 MED ORDER — SUGAMMADEX SODIUM 200 MG/2ML IV SOLN
INTRAVENOUS | Status: DC | PRN
  Administered 2020-07-30: 200 mg via INTRAVENOUS

## 2020-07-30 MED ORDER — ONDANSETRON HCL 4 MG/2ML IJ SOLN
INTRAMUSCULAR | Status: DC | PRN
  Administered 2020-07-30: 4 mg via INTRAVENOUS

## 2020-07-30 MED ORDER — LIDOCAINE 2% (20 MG/ML) 5 ML SYRINGE
INTRAMUSCULAR | Status: AC
  Filled 2020-07-30: qty 5

## 2020-07-30 MED ORDER — ATROPINE SULFATE 1 % OP SOLN
OPHTHALMIC | Status: AC
  Filled 2020-07-30: qty 5

## 2020-07-30 MED ORDER — BACITRACIN-POLYMYXIN B 500-10000 UNIT/GM OP OINT
TOPICAL_OINTMENT | OPHTHALMIC | Status: DC | PRN
  Administered 2020-07-30: 1 via OPHTHALMIC

## 2020-07-30 MED ORDER — PROMETHAZINE HCL 25 MG/ML IJ SOLN: 6.2500 mg | INTRAMUSCULAR | Status: DC | PRN

## 2020-07-30 MED ORDER — BACITRACIN-POLYMYXIN B 500-10000 UNIT/GM OP OINT
TOPICAL_OINTMENT | OPHTHALMIC | Status: AC
  Filled 2020-07-30: qty 3.5

## 2020-07-30 MED ORDER — BSS PLUS IO SOLN
INTRAOCULAR | Status: AC
  Filled 2020-07-30: qty 500

## 2020-07-30 MED ORDER — CYCLOPENTOLATE HCL 1 % OP SOLN
1.0000 [drp] | OPHTHALMIC | Status: AC | PRN
  Administered 2020-07-30 (×3): 1 [drp] via OPHTHALMIC
  Filled 2020-07-30: qty 2

## 2020-07-30 MED ORDER — TROPICAMIDE 1 % OP SOLN
1.0000 [drp] | OPHTHALMIC | Status: AC | PRN
  Administered 2020-07-30 (×3): 1 [drp] via OPHTHALMIC
  Filled 2020-07-30: qty 15

## 2020-07-30 SURGICAL SUPPLY — 73 items
APPLICATOR DR MATTHEWS STRL (MISCELLANEOUS) ×1 IMPLANT
BAND WRIST GAS GREEN (MISCELLANEOUS) IMPLANT
BLADE KERATOME 2.75 (BLADE) ×2 IMPLANT
BLADE MVR KNIFE 20G (BLADE) ×1 IMPLANT
BNDG EYE OVAL (GAUZE/BANDAGES/DRESSINGS) ×1 IMPLANT
CABLE BIPOLOR RESECTION CORD (MISCELLANEOUS) ×2 IMPLANT
CANNULA VLV SOFT TIP 25G (OPHTHALMIC) ×1 IMPLANT
CANNULA VLV SOFT TIP 25GA (OPHTHALMIC) ×2 IMPLANT
COTTONBALL LRG STERILE PKG (GAUZE/BANDAGES/DRESSINGS) ×6 IMPLANT
COVER MAYO STAND STRL (DRAPES) ×1 IMPLANT
COVER WAND RF STERILE (DRAPES) ×1 IMPLANT
DRAPE INCISE 51X51 W/FILM STRL (DRAPES) ×1 IMPLANT
DRAPE OPHTHALMIC 77X100 STRL (CUSTOM PROCEDURE TRAY) ×2 IMPLANT
FILTER BLUE MILLIPORE (MISCELLANEOUS) IMPLANT
FORCEPS ECKARDT ILM 25G SERR (OPHTHALMIC RELATED) ×1 IMPLANT
FORCEPS GRIESHABER ILM 25G A (INSTRUMENTS) ×2 IMPLANT
FORCEPS HORIZONTAL 25G DISP (OPHTHALMIC RELATED) IMPLANT
GAS AUTO FILL CONSTEL (OPHTHALMIC) ×2
GAS AUTO FILL CONSTELLATION (OPHTHALMIC) IMPLANT
GAS OPHTHALMIC (MISCELLANEOUS) IMPLANT
GAS WRIST BAND GREEN (MISCELLANEOUS) ×1
GLOVE SS BIOGEL STRL SZ 6.5 (GLOVE) ×2 IMPLANT
GLOVE SS BIOGEL STRL SZ 7 (GLOVE) ×1 IMPLANT
GLOVE SUPERSENSE BIOGEL SZ 6.5 (GLOVE) ×2
GLOVE SUPERSENSE BIOGEL SZ 7 (GLOVE)
GLOVE SURG 8.5 LATEX PF (GLOVE) ×1 IMPLANT
GLOVE TRIUMPH SURG SIZE 8.5 (KITS) ×2 IMPLANT
GOWN STRL REUS W/ TWL LRG LVL3 (GOWN DISPOSABLE) ×3 IMPLANT
GOWN STRL REUS W/TWL LRG LVL3 (GOWN DISPOSABLE) ×3
HANDLE PNEUMATIC FOR CONSTEL (OPHTHALMIC) ×2 IMPLANT
KIT BASIN OR (CUSTOM PROCEDURE TRAY) ×2 IMPLANT
KIT TURNOVER KIT B (KITS) ×2 IMPLANT
LENS IOL POST 1PIECE DIOP 25.0 (Intraocular Lens) ×1 IMPLANT
MICROPICK 25G (MISCELLANEOUS) ×2
NDL 18GX1X1/2 (RX/OR ONLY) (NEEDLE) ×1 IMPLANT
NDL 25GX 5/8IN NON SAFETY (NEEDLE) ×1 IMPLANT
NDL 27GX1/2 REG BEVEL ECLIP (NEEDLE) IMPLANT
NDL FILTER BLUNT 18X1 1/2 (NEEDLE) ×1 IMPLANT
NDL HYPO 30X.5 LL (NEEDLE) ×1 IMPLANT
NEEDLE 18GX1X1/2 (RX/OR ONLY) (NEEDLE) ×2 IMPLANT
NEEDLE 25GX 5/8IN NON SAFETY (NEEDLE) ×2 IMPLANT
NEEDLE 27GX1/2 REG BEVEL ECLIP (NEEDLE) IMPLANT
NEEDLE FILTER BLUNT 18X 1/2SAF (NEEDLE) ×1
NEEDLE FILTER BLUNT 18X1 1/2 (NEEDLE) ×1 IMPLANT
NEEDLE HYPO 30X.5 LL (NEEDLE) ×2 IMPLANT
NS IRRIG 1000ML POUR BTL (IV SOLUTION) ×2 IMPLANT
PACK FRAGMATOME (OPHTHALMIC) ×1 IMPLANT
PACK VITRECTOMY CUSTOM (CUSTOM PROCEDURE TRAY) ×2 IMPLANT
PAD ARMBOARD 7.5X6 YLW CONV (MISCELLANEOUS) ×4 IMPLANT
PAK PIK VITRECTOMY CVS 25GA (OPHTHALMIC) ×2 IMPLANT
PENCIL BIPOLAR 25GA STR DISP (OPHTHALMIC RELATED) ×1 IMPLANT
PIC ILLUMINATED 25G (OPHTHALMIC) ×2
PICK MICROPICK 25G (MISCELLANEOUS) IMPLANT
PIK ILLUMINATED 25G (OPHTHALMIC) ×1 IMPLANT
PROBE LASER ILLUM FLEX CVD 25G (OPHTHALMIC) ×1 IMPLANT
ROLLS DENTAL (MISCELLANEOUS) ×4 IMPLANT
SHIELD EYE LENSE ONLY DISP (GAUZE/BANDAGES/DRESSINGS) ×1 IMPLANT
SPONGE SURGIFOAM ABS GEL 12-7 (HEMOSTASIS) ×2 IMPLANT
STOPCOCK 4 WAY LG BORE MALE ST (IV SETS) IMPLANT
SUT CHROMIC 7 0 TG140 8 (SUTURE) ×2 IMPLANT
SUT ETHILON 10 0 CS140 6 (SUTURE) ×2 IMPLANT
SUT ETHILON 9 0 TG140 8 (SUTURE) ×1 IMPLANT
SUT POLY NON ABSORB 10-0 8 STR (SUTURE) ×5 IMPLANT
SUT SILK 4 0 RB 1 (SUTURE) ×1 IMPLANT
SYR 10ML LL (SYRINGE) ×2 IMPLANT
SYR 20ML LL LF (SYRINGE) ×2 IMPLANT
SYR 5ML LL (SYRINGE) IMPLANT
SYR BULB EAR ULCER 3OZ GRN STR (SYRINGE) ×2 IMPLANT
SYR TB 1ML LUER SLIP (SYRINGE) ×2 IMPLANT
TAPE SURG TRANSPORE 1 IN (GAUZE/BANDAGES/DRESSINGS) ×1 IMPLANT
TAPE SURGICAL TRANSPORE 1 IN (GAUZE/BANDAGES/DRESSINGS) ×1
TUBING HIGH PRESS EXTEN 6IN (TUBING) IMPLANT
WATER STERILE IRR 1000ML POUR (IV SOLUTION) ×2 IMPLANT

## 2020-07-30 NOTE — Op Note (Signed)
NAME: Derek Fry, Derek Fry MEDICAL RECORD NO: [id number] ACCOUNT NO: [id number] DATE OF BIRTH: [birth year] FACILITY: [facility] LOCATION: [facility] PHYSICIAN: Beulah Gandy. Ashley Royalty, MD  Operative Report   DATE OF PROCEDURE: 07/30/2020  PROCEDURES PERFORMED:  1.  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 2.  Retinal photocoagulation, right eye. 3.  Placement of secondary intraocular lens with suture, right eye. 4.  Gas fluid exchange, right eye.  SURGEON:  Alan Mulder, MD  ASSISTANT:  Rosalie Doctor, SA  ANESTHESIA:  General.  DESCRIPTION OF PROCEDURE:  Usual prep and drape.  The indirect ophthalmoscope laser was moved into place.  1220 burns were placed around the retinal periphery in two concentric rows.  The power was between 300 and 400 milliwatts, 1000 microns each, and  0.1 seconds each.  Attention was then carried to the pars plana area.  A conjunctival peritomy was created from 8 o'clock around to 4 o'clock.  Half-thickness scleral flaps were raised at 3 and 9 o'clock in anticipation of IOL suture.  A 7 mm scleral  incision was made between 10 and 2 o'clock in anticipation of IOL placement.  A 25-gauge trocars were placed at 8 and 10 o'clock.  A 3 layered scleral tunnel wound was made at 2 o'clock to accommodate the instruments and fragmatome.  Provisc was placed  on the corneal surface.  The lighted pick and the vitreous cutter were positioned into place and the BIOM viewing system was moved into place.  All vitreous was removed from the anterior segment and from the posterior segment, a core vitrectomy was  carried out.  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  All vitreous was removed from the attachments to the cataract.  The cataract was dislocated into the vitreous and lying on the macular  surface.  The lighted pick was used to incise the lens capsule.  A large amount of white milky material came  forth immediately filling the vitreous cavity.  The milk was carefully removed and with suction and cutting.  A thick leathery capsular bag was  then removed with the vitreous cutter and with some fragmentation.  This allowed a dark brown, red nucleus to remain in the vitreous cavity.  The nucleus was removed with a phaco fragmentation.  Additional vitrectomy was carried out, so that all remnants  and all particles of the vitreous and particles of the lens were removed from the vitreous cavity.  The vitreous was rinsed and rinsed to be certain that every piece was gone.  Attention was then carried back to the anterior segment where 2 Prolene  sutures were placed beneath the scleral flaps and behind the iris from 3 o'clock to 9 o'clock.  A docking suture method was used for placement of the sutures.  The sutures were externalized through the corneoscleral wound.  The corneal scleral wound had  been created with the diamond knife and keratome into the anterior segment.  A new intraocular lens was brought onto the field, made by [employer], model CZ70VD, power 25.0D, length 12.5 mm, optic 7.0 mm, serial number [id number],  expiration date 12/02/2022.  The lens was brought onto the field.  It was cleaned and inspected.  The Prolene sutures were attached to the eyelets of the lens.  The lens was placed through the corneoscleral wound into the anterior segment and then into  the ciliary sulcus.  The Prolene sutures were drawn securely and the lens  was dialed into place.  The Prolene sutures were knotted externally beneath the scleral flaps and the free ends removed.  The scleral flaps were used to cover the knots.  The  cornea was closed with 5 interrupted 10-0 nylon sutures.  The wound was tested and found to be secure.  Additional pars plana vitrectomy was performed at this time to remove a small amount of blood from the vitreous cavity.  Once the vitreous cavity was  totally cleansed, 30% gas  fluid exchange was carried out.  The instruments were removed from the eye.  9-0 nylon was used to close the sclerotomy site at 2 o'clock.  25-gauge trocars were removed.  The wounds were tested and found to be secure.  The  conjunctiva was reposited with 7-0 chromic suture.  Polymyxin and ceftazidime were rinsed around the globe for antibiotic coverage.  Decadron 10 mg was injected into the lower subconjunctival space.  Marcaine was injected around the globe for  postoperative pain.  The closing pressure was 10 with a Barraquer tonometer.  Complications, none.  Duration 3 hours.  Polysporin, a patch and shield were placed.  The patient was awakened and taken to recovery in satisfactory condition.   ROH D: 07/30/2020 2:43:54 pm T: 07/30/2020 5:15:00 pm  JOB: [id number]

## 2020-07-30 NOTE — Anesthesia Procedure Notes (Signed)
Procedure Name: Intubation Date/Time: 07/30/2020 11:46 AM Performed by: Mayer Camel, CRNA Pre-anesthesia Checklist: Patient identified, Emergency Drugs available, Suction available and Patient being monitored Patient Re-evaluated:Patient Re-evaluated prior to induction Oxygen Delivery Method: Circle System Utilized Preoxygenation: Pre-oxygenation with 100% oxygen Induction Type: IV induction Ventilation: Mask ventilation without difficulty Laryngoscope Size: Miller and 2 Grade View: Grade I Tube type: Oral Tube size: 7.5 mm Number of attempts: 1 Airway Equipment and Method: Stylet and Oral airway Placement Confirmation: ETT inserted through vocal cords under direct vision,  positive ETCO2 and breath sounds checked- equal and bilateral Secured at: 23 cm Tube secured with: Tape Dental Injury: Teeth and Oropharynx as per pre-operative assessment

## 2020-07-30 NOTE — Transfer of Care (Signed)
Immediate Anesthesia Transfer of Care Note  Patient: Derek Fry  Procedure(s) Performed: PARS PLANA VITRECTOMY WITH 25G REMOVAL/SUTURE INTRAOCULAR LENS, FRAGMENTATION (Right Eye) LASER PHOTO ABLATION (Right Eye) CATARACT EXTRACTION EXTRACAPSULAR WITH INTRAOCULAR LENS PLACEMENT (IOC) (Right Eye) AIR/FLUID EXCHANGE (Right Eye)  Patient Location: PACU  Anesthesia Type:General  Level of Consciousness: awake, alert  and oriented  Airway & Oxygen Therapy: Patient Spontanous Breathing and Patient connected to face mask oxygen  Post-op Assessment: Report given to RN and Post -op Vital signs reviewed and stable  Post vital signs: Reviewed and stable  Last Vitals:  Vitals Value Taken Time  BP 106/90 07/30/20 1445  Temp 37 C 07/30/20 1445  Pulse 89 07/30/20 1456  Resp 15 07/30/20 1456  SpO2 93 % 07/30/20 1456  Vitals shown include unvalidated device data.  Last Pain:  Vitals:   07/30/20 1445  TempSrc:   PainSc: 0-No pain      Patients Stated Pain Goal: 3 (07/30/20 0935)  Complications: No complications documented.

## 2020-07-30 NOTE — Anesthesia Postprocedure Evaluation (Signed)
Anesthesia Post Note  Patient: Derek Fry  Procedure(s) Performed: PARS PLANA VITRECTOMY WITH 25G REMOVAL/SUTURE INTRAOCULAR LENS, FRAGMENTATION (Right Eye) LASER PHOTO ABLATION (Right Eye) CATARACT EXTRACTION EXTRACAPSULAR WITH INTRAOCULAR LENS PLACEMENT (IOC) (Right Eye) AIR/FLUID EXCHANGE (Right Eye)     Patient location during evaluation: PACU Anesthesia Type: General Level of consciousness: awake and alert, patient cooperative and oriented Pain management: pain level controlled Vital Signs Assessment: post-procedure vital signs reviewed and stable Respiratory status: spontaneous breathing, nonlabored ventilation and respiratory function stable Cardiovascular status: blood pressure returned to baseline and stable Postop Assessment: no apparent nausea or vomiting and able to ambulate Anesthetic complications: no   No complications documented.  Last Vitals:  Vitals:   07/30/20 1500 07/30/20 1515  BP: (!) 131/57 (!) 144/63  Pulse: 90 89  Resp: 18 18  Temp:  37 C  SpO2: 93% 94%    Last Pain:  Vitals:   07/30/20 1515  TempSrc:   PainSc: 0-No pain                 Leather Estis,E. Trumaine Wimer

## 2020-07-30 NOTE — Anesthesia Preprocedure Evaluation (Addendum)
Anesthesia Evaluation  Patient identified by MRN, date of birth, ID band Patient awake    Reviewed: Allergy & Precautions, NPO status , Patient's Chart, lab work & pertinent test results  History of Anesthesia Complications Negative for: history of anesthetic complications  Airway Mallampati: II  TM Distance: >3 FB Neck ROM: Full    Dental  (+) Chipped, Dental Advisory Given, Missing   Pulmonary Current Smoker and Patient abstained from smoking.,  07/26/2020 SARS coronavirus NEG   breath sounds clear to auscultation       Cardiovascular hypertension (on no medications),  Rhythm:Regular Rate:Normal     Neuro/Psych negative neurological ROS     GI/Hepatic negative GI ROS, Neg liver ROS,   Endo/Other  Hypothyroidism   Renal/GU negative Renal ROS     Musculoskeletal   Abdominal (+) + obese,   Peds  Hematology negative hematology ROS (+)   Anesthesia Other Findings   Reproductive/Obstetrics                            Anesthesia Physical Anesthesia Plan  ASA: II  Anesthesia Plan: General   Post-op Pain Management:    Induction: Intravenous  PONV Risk Score and Plan: 1 and Ondansetron and Dexamethasone  Airway Management Planned: Oral ETT  Additional Equipment: None  Intra-op Plan:   Post-operative Plan: Extubation in OR  Informed Consent: I have reviewed the patients History and Physical, chart, labs and discussed the procedure including the risks, benefits and alternatives for the proposed anesthesia with the patient or authorized representative who has indicated his/her understanding and acceptance.     Dental advisory given  Plan Discussed with: CRNA and Surgeon  Anesthesia Plan Comments:        Anesthesia Quick Evaluation

## 2020-07-30 NOTE — Brief Op Note (Signed)
Brief Operative note   Preoperative diagnosis:  Dislocated Cataract right eye Postoperative diagnosis  Same  Procedures: Pars plana vitrectomy,  Removal of dislocated, Morganian cataract from vitreous.  Placement of secondary intraocular lens with suture right eye.  Laser right eye,  Gas fluid exchange right eye  Surgeon:  Sherrie George, MD...  Assistant:  Rosalie Doctor SA    Anesthesia: General  Specimen: none  Estimated blood loss:  1cc  Complications: none  Patient sent to PACU in good condition  Composed by Sherrie George MD  Dictation number: [phone]

## 2020-07-30 NOTE — H&P (Signed)
I examined the patient today and there is no change in the medical status 

## 2020-07-31 ENCOUNTER — Encounter (HOSPITAL_COMMUNITY): Payer: Self-pay | Admitting: Ophthalmology

## 2020-07-31 ENCOUNTER — Encounter (INDEPENDENT_AMBULATORY_CARE_PROVIDER_SITE_OTHER): Payer: Medicare Other | Admitting: Ophthalmology

## 2020-07-31 DIAGNOSIS — H27131 Posterior dislocation of lens, right eye: Secondary | ICD-10-CM

## 2020-08-06 ENCOUNTER — Other Ambulatory Visit: Payer: Self-pay

## 2020-08-06 ENCOUNTER — Encounter (INDEPENDENT_AMBULATORY_CARE_PROVIDER_SITE_OTHER): Payer: Medicare Other | Admitting: Ophthalmology

## 2020-08-06 DIAGNOSIS — H27131 Posterior dislocation of lens, right eye: Secondary | ICD-10-CM

## 2020-08-27 ENCOUNTER — Encounter (INDEPENDENT_AMBULATORY_CARE_PROVIDER_SITE_OTHER): Payer: Medicare Other | Admitting: Ophthalmology

## 2020-08-30 ENCOUNTER — Encounter (INDEPENDENT_AMBULATORY_CARE_PROVIDER_SITE_OTHER): Payer: Medicare Other | Admitting: Ophthalmology

## 2020-08-30 ENCOUNTER — Other Ambulatory Visit: Payer: Self-pay

## 2020-08-30 DIAGNOSIS — H27131 Posterior dislocation of lens, right eye: Secondary | ICD-10-CM

## 2020-11-08 ENCOUNTER — Encounter (INDEPENDENT_AMBULATORY_CARE_PROVIDER_SITE_OTHER): Payer: Medicare Other | Admitting: Ophthalmology

## 2020-11-11 ENCOUNTER — Other Ambulatory Visit: Payer: Self-pay

## 2020-11-11 ENCOUNTER — Encounter (INDEPENDENT_AMBULATORY_CARE_PROVIDER_SITE_OTHER): Payer: Medicare Other | Admitting: Ophthalmology

## 2020-11-11 DIAGNOSIS — H43811 Vitreous degeneration, right eye: Secondary | ICD-10-CM | POA: Diagnosis not present

## 2020-11-11 DIAGNOSIS — Z961 Presence of intraocular lens: Secondary | ICD-10-CM | POA: Diagnosis not present

## 2021-01-30 ENCOUNTER — Ambulatory Visit (LOCAL_COMMUNITY_HEALTH_CENTER): Payer: Self-pay

## 2021-01-30 ENCOUNTER — Other Ambulatory Visit: Payer: Self-pay

## 2021-01-30 DIAGNOSIS — Z719 Counseling, unspecified: Secondary | ICD-10-CM

## 2021-01-30 DIAGNOSIS — Z23 Encounter for immunization: Secondary | ICD-10-CM

## 2021-02-05 NOTE — Progress Notes (Signed)
Mpx given to left FA today and tolerated well. Mjones,lpn

## 2021-02-27 ENCOUNTER — Ambulatory Visit: Payer: No Typology Code available for payment source

## 2021-05-16 ENCOUNTER — Other Ambulatory Visit: Payer: Self-pay

## 2021-05-16 ENCOUNTER — Encounter (INDEPENDENT_AMBULATORY_CARE_PROVIDER_SITE_OTHER): Payer: Medicare Other | Admitting: Ophthalmology

## 2021-05-16 DIAGNOSIS — Z961 Presence of intraocular lens: Secondary | ICD-10-CM

## 2021-07-14 ENCOUNTER — Telehealth: Payer: Self-pay | Admitting: Acute Care

## 2021-07-14 NOTE — Telephone Encounter (Signed)
Left voicemail and call back number to call for scheduling of LDCT ?

## 2021-07-15 ENCOUNTER — Other Ambulatory Visit: Payer: Self-pay

## 2021-07-15 DIAGNOSIS — F17210 Nicotine dependence, cigarettes, uncomplicated: Secondary | ICD-10-CM

## 2021-07-15 DIAGNOSIS — Z87891 Personal history of nicotine dependence: Secondary | ICD-10-CM

## 2021-07-30 ENCOUNTER — Other Ambulatory Visit: Payer: Self-pay

## 2021-07-30 ENCOUNTER — Ambulatory Visit
Admission: RE | Admit: 2021-07-30 | Discharge: 2021-07-30 | Disposition: A | Discharge status: Home / Self Care | Payer: Medicare Other | Source: Ambulatory Visit | Attending: Acute Care | Admitting: Acute Care

## 2021-07-30 DIAGNOSIS — F17210 Nicotine dependence, cigarettes, uncomplicated: Secondary | ICD-10-CM | POA: Insufficient documentation

## 2021-07-30 DIAGNOSIS — Z87891 Personal history of nicotine dependence: Secondary | ICD-10-CM | POA: Insufficient documentation

## 2021-08-01 ENCOUNTER — Other Ambulatory Visit: Payer: Self-pay

## 2021-08-01 DIAGNOSIS — F17210 Nicotine dependence, cigarettes, uncomplicated: Secondary | ICD-10-CM

## 2021-08-01 DIAGNOSIS — Z87891 Personal history of nicotine dependence: Secondary | ICD-10-CM

## 2022-05-18 ENCOUNTER — Encounter (INDEPENDENT_AMBULATORY_CARE_PROVIDER_SITE_OTHER): Payer: Medicare Other | Admitting: Ophthalmology

## 2022-05-18 DIAGNOSIS — Z961 Presence of intraocular lens: Secondary | ICD-10-CM

## 2022-07-31 ENCOUNTER — Ambulatory Visit
Admission: RE | Admit: 2022-07-31 | Discharge: 2022-07-31 | Disposition: A | Discharge status: Home / Self Care | Payer: Medicare Other | Source: Ambulatory Visit | Attending: Acute Care | Admitting: Acute Care

## 2022-07-31 DIAGNOSIS — I700 Atherosclerosis of aorta: Secondary | ICD-10-CM | POA: Diagnosis not present

## 2022-07-31 DIAGNOSIS — Z87891 Personal history of nicotine dependence: Secondary | ICD-10-CM

## 2022-07-31 DIAGNOSIS — K5730 Diverticulosis of large intestine without perforation or abscess without bleeding: Secondary | ICD-10-CM | POA: Diagnosis not present

## 2022-07-31 DIAGNOSIS — Z122 Encounter for screening for malignant neoplasm of respiratory organs: Secondary | ICD-10-CM | POA: Diagnosis not present

## 2022-07-31 DIAGNOSIS — F17210 Nicotine dependence, cigarettes, uncomplicated: Secondary | ICD-10-CM | POA: Diagnosis present

## 2022-07-31 DIAGNOSIS — J439 Emphysema, unspecified: Secondary | ICD-10-CM | POA: Diagnosis not present

## 2022-08-03 ENCOUNTER — Other Ambulatory Visit: Payer: Self-pay | Admitting: Acute Care

## 2022-08-03 DIAGNOSIS — Z87891 Personal history of nicotine dependence: Secondary | ICD-10-CM

## 2022-08-03 DIAGNOSIS — Z122 Encounter for screening for malignant neoplasm of respiratory organs: Secondary | ICD-10-CM

## 2022-08-03 DIAGNOSIS — F17210 Nicotine dependence, cigarettes, uncomplicated: Secondary | ICD-10-CM

## 2022-12-30 IMAGING — CT CT CHEST LUNG CANCER SCREENING LOW DOSE W/O CM
2 of 5 series · 15 of 40 positions shown, 18 images · non-contrast
Comparison: 04/30/2020

CLINICAL DATA: Lung cancer screening. Sixty-one pack-year history.
Current asymptomatic smoker.



[Series 3: lung 1.00 · axial · 0.81mm/px · z∈[-1252,-886]mm · 12 of 404 slices shown, 15 images]
[im 19/404  mediastinal]
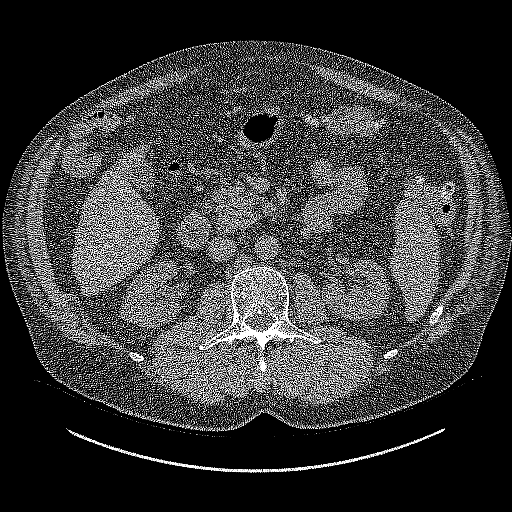
[im 19/404  lung]
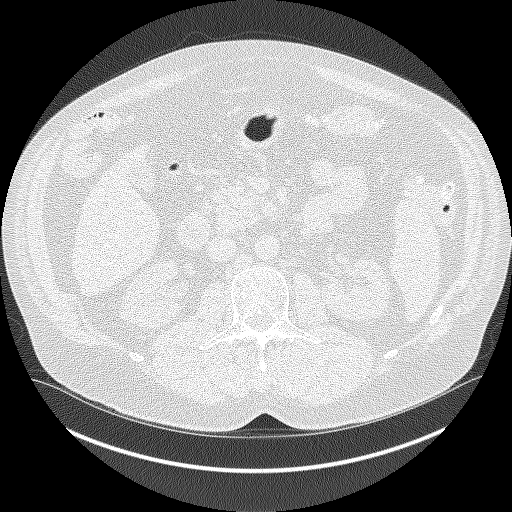
[im 55/404  lung]
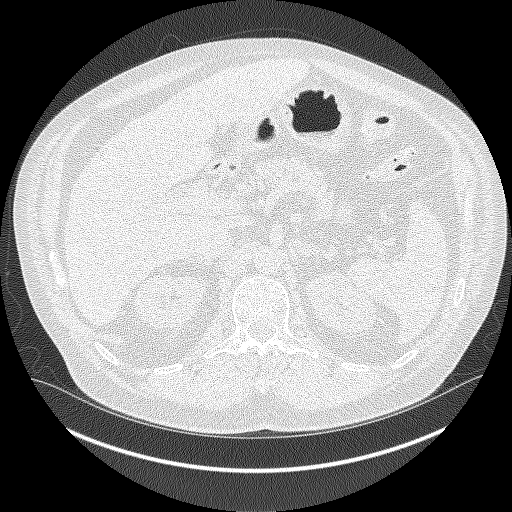
[im 92/404  lung]
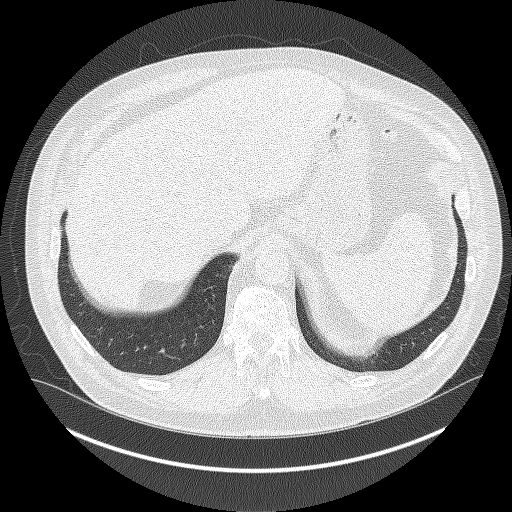
[im 129/404  lung]
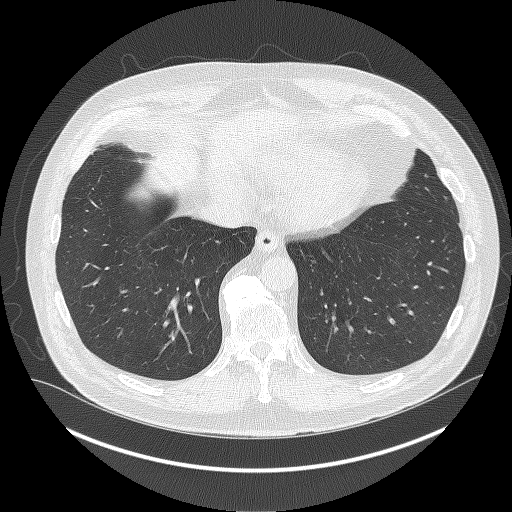
[im 147/404  mediastinal]
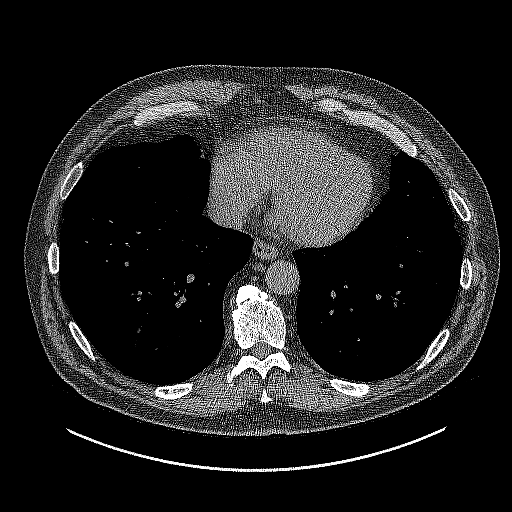
[im 147/404  lung]
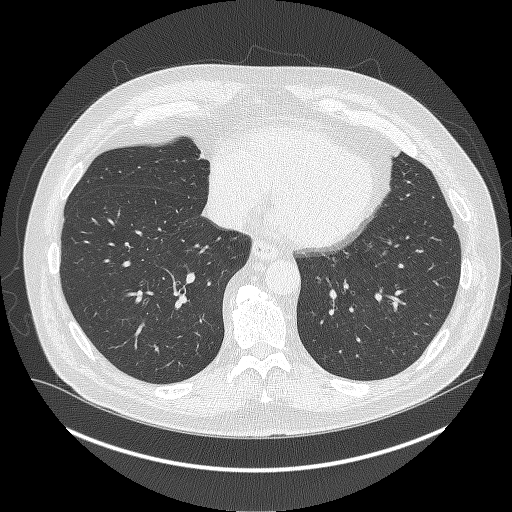
[im 184/404  lung]
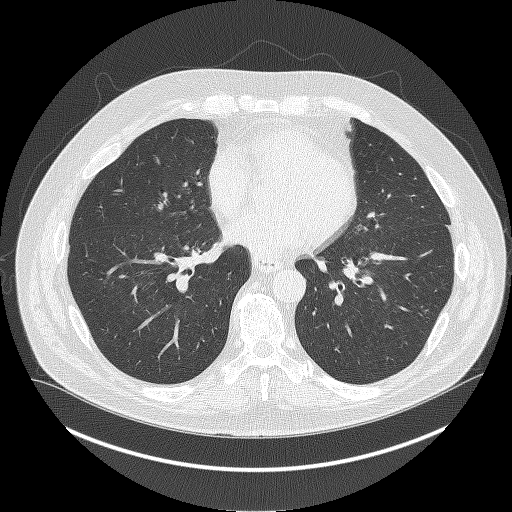
[im 220/404  lung]
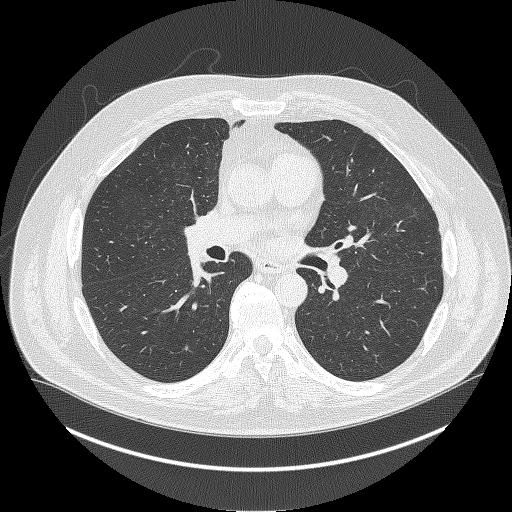
[im 257/404  lung]
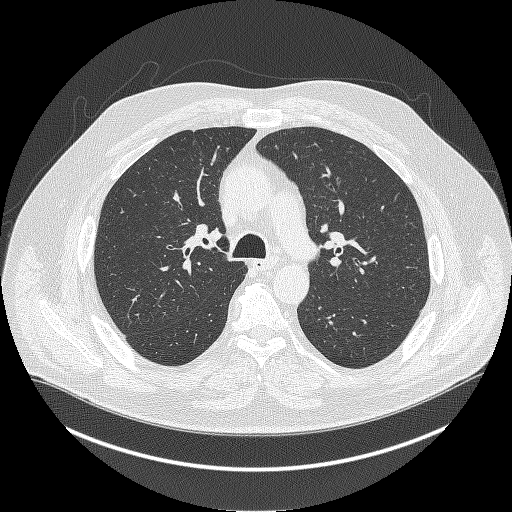
[im 275/404  mediastinal]
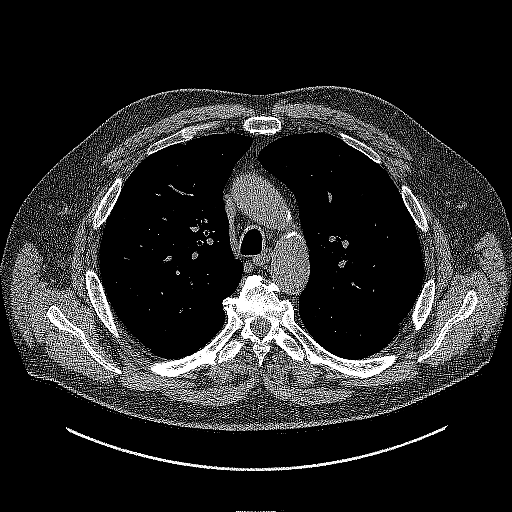
[im 275/404  lung]
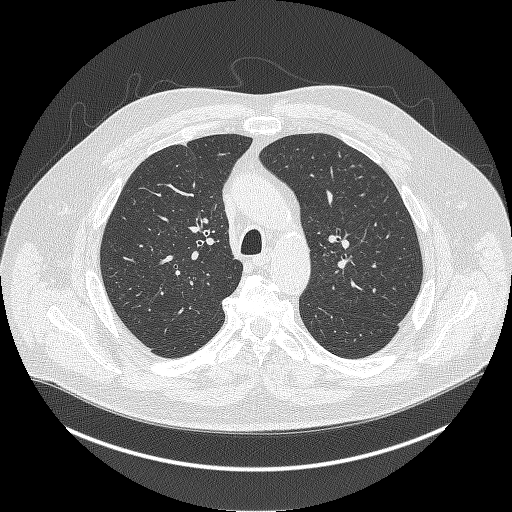
[im 312/404  lung]
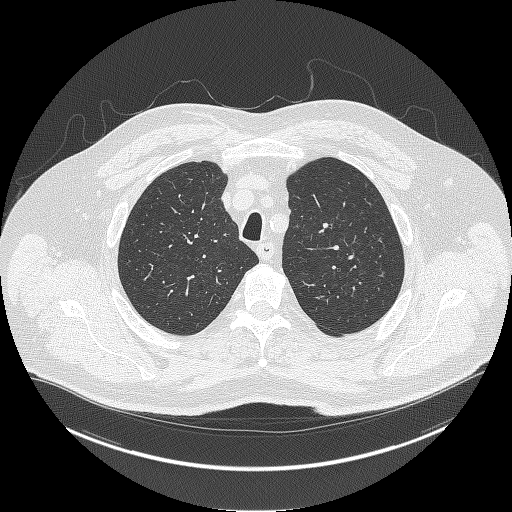
[im 349/404  lung]
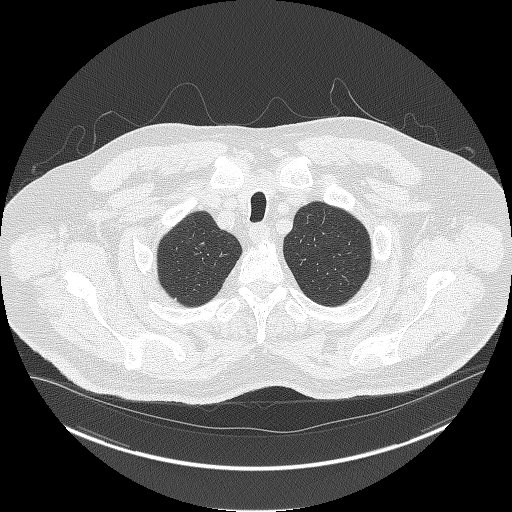
[im 385/404  lung]
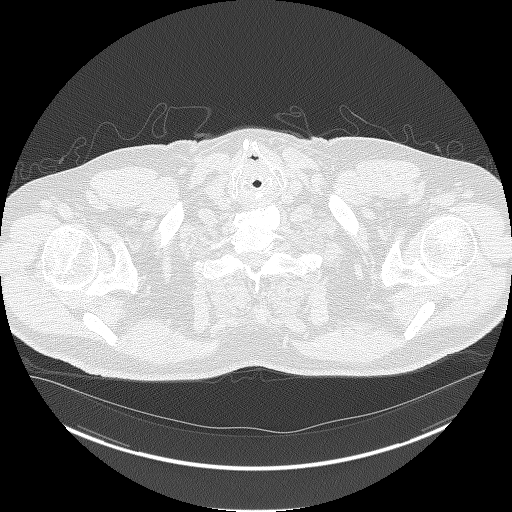

[Series 5: coronals lung 1.00 cor · coronal · 0.79mm/px · 3 of 395 slices shown]
[im 79/395  lung]
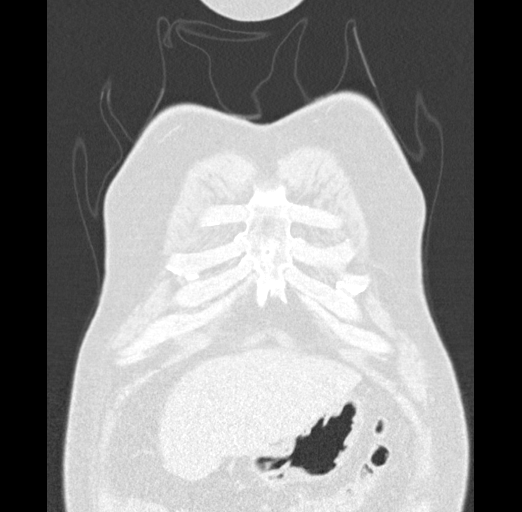
[im 158/395  lung]
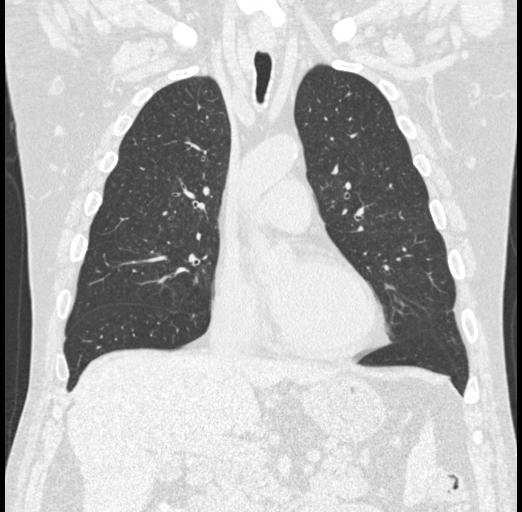
[im 237/395  lung]
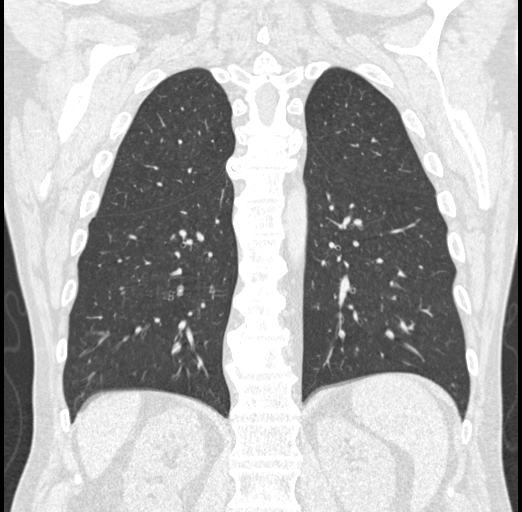

[15 of 40 positions shown; findings below may reference images not displayed]

FINDINGS: Cardiovascular: The heart size appears within normal limits. Aortic
atherosclerosis. No pericardial effusion.

Mediastinum/Nodes: No enlarged mediastinal, hilar, or axillary lymph
nodes. Thyroid gland, trachea, and esophagus demonstrate no
significant findings.

Lungs/Pleura: No pleural effusion, airspace consolidation, or
atelectasis. Mild centrilobular emphysema. No suspicious lung
nodules identified.

Upper Abdomen: No acute abnormality. Contour of the liver is
slightly irregular.

Musculoskeletal: No chest wall mass or suspicious bone lesions
identified.
IMPRESSION: 1. Lung-RADS 1, negative. Continue annual screening with low-dose
chest CT without contrast in 12 months.
2. Aortic Atherosclerosis (64895-L04.4) and Emphysema (64895-SL8.T).

## 2023-05-19 ENCOUNTER — Encounter (INDEPENDENT_AMBULATORY_CARE_PROVIDER_SITE_OTHER): Payer: Medicare Other | Admitting: Ophthalmology

## 2023-05-19 DIAGNOSIS — Z961 Presence of intraocular lens: Secondary | ICD-10-CM | POA: Diagnosis not present

## 2023-07-14 ENCOUNTER — Other Ambulatory Visit: Payer: Self-pay | Admitting: Internal Medicine

## 2023-07-14 DIAGNOSIS — F17210 Nicotine dependence, cigarettes, uncomplicated: Secondary | ICD-10-CM

## 2023-07-14 DIAGNOSIS — Z720 Tobacco use: Secondary | ICD-10-CM

## 2023-08-02 ENCOUNTER — Ambulatory Visit
Admission: RE | Admit: 2023-08-02 | Discharge: 2023-08-02 | Disposition: A | Discharge status: Home / Self Care | Payer: No Typology Code available for payment source | Source: Ambulatory Visit | Attending: Acute Care | Admitting: Acute Care

## 2023-08-02 DIAGNOSIS — F17210 Nicotine dependence, cigarettes, uncomplicated: Secondary | ICD-10-CM | POA: Diagnosis present

## 2023-08-02 DIAGNOSIS — Z122 Encounter for screening for malignant neoplasm of respiratory organs: Secondary | ICD-10-CM | POA: Diagnosis present

## 2023-08-02 DIAGNOSIS — Z87891 Personal history of nicotine dependence: Secondary | ICD-10-CM | POA: Insufficient documentation

## 2023-09-06 ENCOUNTER — Other Ambulatory Visit: Payer: Self-pay

## 2023-09-06 DIAGNOSIS — F17210 Nicotine dependence, cigarettes, uncomplicated: Secondary | ICD-10-CM

## 2023-09-06 DIAGNOSIS — Z122 Encounter for screening for malignant neoplasm of respiratory organs: Secondary | ICD-10-CM

## 2023-09-06 DIAGNOSIS — Z87891 Personal history of nicotine dependence: Secondary | ICD-10-CM

## 2024-03-08 ENCOUNTER — Ambulatory Visit (INDEPENDENT_AMBULATORY_CARE_PROVIDER_SITE_OTHER)

## 2024-03-08 DIAGNOSIS — Z1283 Encounter for screening for malignant neoplasm of skin: Secondary | ICD-10-CM

## 2024-03-08 DIAGNOSIS — L814 Other melanin hyperpigmentation: Secondary | ICD-10-CM

## 2024-03-08 DIAGNOSIS — L821 Other seborrheic keratosis: Secondary | ICD-10-CM

## 2024-03-08 DIAGNOSIS — L570 Actinic keratosis: Secondary | ICD-10-CM | POA: Diagnosis not present

## 2024-03-08 DIAGNOSIS — D1801 Hemangioma of skin and subcutaneous tissue: Secondary | ICD-10-CM | POA: Diagnosis not present

## 2024-03-08 DIAGNOSIS — L578 Other skin changes due to chronic exposure to nonionizing radiation: Secondary | ICD-10-CM

## 2024-03-08 DIAGNOSIS — W908XXA Exposure to other nonionizing radiation, initial encounter: Secondary | ICD-10-CM | POA: Diagnosis not present

## 2024-03-08 NOTE — Progress Notes (Signed)
    Subjective   Derek Fry is a 68 y.o. male who presents for the following: The patient presents for Upper Body Skin Exam (UBSE) for skin cancer screening and mole check.  The patient has spots, moles and lesions to be evaluated, some may be new or changing and the patient has concerns that these could be cancer. Patient is new patient    Review of Systems:    No other skin or systemic complaints except as noted in HPI or Assessment and Plan.  The following portions of the chart were reviewed this encounter and updated as appropriate: medications, allergies, medical history  Relevant Medical History:  Reviewed   Objective  Well appearing patient in no apparent distress; mood and affect are within normal limits. Examination was performed of the: Waist Up Skin Exam: scalp, head, eyes, ears, nose, lips, neck, chest, axillae, upper extremities, abdomen, back, hands, fingers, fingernails   Examination notable for: SKIN EXAM, Angioma(s): Scattered red vascular papule(s)  , Lentigo/lentigines: Scattered pigmented macules that are tan to brown in color and are somewhat non-uniform in shape and concentrated in the sun-exposed areas, Nevus/nevi: Scattered well-demarcated, regular, pigmented macule(s) and/or papule(s)  , Seborrheic Keratosis(es): Stuck-on appearing keratotic papule(s) on the trunk, some  irritated with redness, crusting, edema, and/or partial avulsion, Actinic Damage/Elastosis: chronic sun damage: dyspigmentation, telangiectasia, and wrinkling, Actinic keratosis: Scaly erythematous macule(s) concentrated on sun exposed areas    face,scalp (8) Erythematous thin papules/macules with gritty scale.   Assessment & Plan   SKIN CANCER SCREENING PERFORMED TODAY.  BENIGN SKIN FINDINGS  - Lentigines  - Seborrheic keratoses  - Hemangiomas   - Nevus/Multiple Benign Nevi - Reassurance provided regarding the benign appearance of lesions noted on exam today; no treatment is indicated in  the absence of symptoms/changes. - Reinforced importance of photoprotective strategies including liberal and frequent sunscreen use of a broad-spectrum SPF 30 or greater, use of protective clothing, and sun avoidance for prevention of cutaneous malignancy and photoaging.  Counseled patient on the importance of regular self-skin monitoring as well as routine clinical skin examinations as scheduled.   ACTINIC DAMAGE - Chronic condition, secondary to cumulative UV/sun exposure - Recommend daily broad spectrum sunscreen SPF 30+ to sun-exposed areas, reapply every 2 hours as needed.  - Staying in the shade or wearing long sleeves, sun glasses (UVA+UVB protection) and wide brim hats (4-inch brim around the entire circumference of the hat) are also recommended for sun protection.  - Call for new or changing lesions.   Level of service outlined above   Procedures, orders, diagnosis for this visit:  AK (ACTINIC KERATOSIS) (8) face,scalp (8) Destruction of lesion - face,scalp (8) Complexity: simple   Destruction method: cryotherapy   Informed consent: discussed and consent obtained   Timeout:  patient name, date of birth, surgical site, and procedure verified Lesion destroyed using liquid nitrogen: Yes   Region frozen until ice ball extended beyond lesion: Yes   Outcome: patient tolerated procedure well with no complications   Post-procedure details: wound care instructions given     AK (actinic keratosis) -     Destruction of lesion    Return to clinic: Return in about 1 year (around 03/08/2025) for UBSE, AKs .  I, Fay Kirks, CMA, am acting as scribe for Lauraine JAYSON Kanaris, MD .   Documentation: I have reviewed the above documentation for accuracy and completeness, and I agree with the above.  Lauraine JAYSON Kanaris, MD

## 2024-03-08 NOTE — Patient Instructions (Addendum)

## 2024-05-16 ENCOUNTER — Encounter (INDEPENDENT_AMBULATORY_CARE_PROVIDER_SITE_OTHER): Payer: Medicare Other | Admitting: Ophthalmology

## 2024-05-22 ENCOUNTER — Encounter (INDEPENDENT_AMBULATORY_CARE_PROVIDER_SITE_OTHER): Admitting: Ophthalmology

## 2024-05-22 DIAGNOSIS — Z961 Presence of intraocular lens: Secondary | ICD-10-CM

## 2024-08-02 ENCOUNTER — Ambulatory Visit

## 2025-03-08 ENCOUNTER — Ambulatory Visit

## 2025-05-22 ENCOUNTER — Encounter (INDEPENDENT_AMBULATORY_CARE_PROVIDER_SITE_OTHER): Admitting: Ophthalmology
